# Patient Record
Sex: Female | Born: 1985 | Race: Black or African American | Hispanic: No | Marital: Single | Smoking: Never smoker
Health system: Southern US, Community
[De-identification: ages and names within clinical notes are randomized; demographics above are authoritative.]

## PROBLEM LIST (undated history)

## (undated) ENCOUNTER — Inpatient Hospital Stay (HOSPITAL_COMMUNITY): Payer: Self-pay

## (undated) DIAGNOSIS — I10 Essential (primary) hypertension: Secondary | ICD-10-CM

## (undated) DIAGNOSIS — T8859XA Other complications of anesthesia, initial encounter: Secondary | ICD-10-CM

## (undated) DIAGNOSIS — T4145XA Adverse effect of unspecified anesthetic, initial encounter: Secondary | ICD-10-CM

## (undated) DIAGNOSIS — A749 Chlamydial infection, unspecified: Secondary | ICD-10-CM

## (undated) HISTORY — PX: THERAPEUTIC ABORTION: SHX798

## (undated) HISTORY — PX: GANGLION CYST EXCISION: SHX1691

## (undated) HISTORY — PX: BREAST SURGERY: SHX581

---

## 2008-08-08 DIAGNOSIS — A749 Chlamydial infection, unspecified: Secondary | ICD-10-CM

## 2008-08-08 HISTORY — DX: Chlamydial infection, unspecified: A74.9

## 2010-07-12 ENCOUNTER — Encounter: Admission: RE | Admit: 2010-07-12 | Discharge: 2010-07-12 | Payer: Self-pay | Admitting: Obstetrics

## 2010-10-04 ENCOUNTER — Other Ambulatory Visit: Payer: Self-pay | Admitting: General Surgery

## 2010-10-04 ENCOUNTER — Ambulatory Visit (HOSPITAL_BASED_OUTPATIENT_CLINIC_OR_DEPARTMENT_OTHER)
Admission: RE | Admit: 2010-10-04 | Discharge: 2010-10-04 | Disposition: A | Discharge status: Home / Self Care | Payer: Medicaid Other | Source: Ambulatory Visit | Attending: General Surgery | Admitting: General Surgery

## 2010-10-04 DIAGNOSIS — N6039 Fibrosclerosis of unspecified breast: Secondary | ICD-10-CM | POA: Insufficient documentation

## 2010-10-14 NOTE — Op Note (Signed)
  NAMELEETTA, Meagan Ashley                ACCOUNT NO.:  [id number]  MEDICAL RECORD NO.:  [id number]           PATIENT TYPE:  LOCATION:                                 FACILITY:  PHYSICIAN:  Cherylynn Ridges, M.D.         DATE OF BIRTH:  DATE OF PROCEDURE:  10/04/2010 DATE OF DISCHARGE:                              OPERATIVE REPORT   PREOPERATIVE DIAGNOSIS:  Left breast mass.  POSTOPERATIVE DIAGNOSIS:  Left breast mass.  PROCEDURE:  Left breast biopsy.  SURGEON:  Cherylynn Ridges, MD  ANESTHESIA:  General with laryngeal airway.  ESTIMATED BLOOD LOSS:  Less than 20 mL.  No complications.  CONDITION:  Stable.  FINDINGS:  No distinct fibroadenoma, but significant fibrosis change in the area of the marked breast mass on preoperative evaluation.  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  OPERATION:  The patient was taken to the operating room and placed on table in supine position.  After an adequate general laryngeal airway anesthetic was administered, she was prepped and draped in usual sterile manner exposing her left breast.  After proper time-out was performed identifying the patient and the procedure being done, a curvilinear incision was made using #15 blade approximately 3-4 cm long.  We dissected out the palpable breast tissue in that area using a #15 blade and also Metzenbaum scissors.  We took two different specimens, making sure that we got the palpable lesion known and also identified preoperatively.  The bulk of the procedure was utilized obtained adequate hemostasis. The patient had significant bleeding from her breast tissue and suture ligatures were necessary in order to control the bleeding and also electrocautery.  We then closed in three layers of deep 3-0  Vicryl layer and one superficial 4-0 Vicryl layer and then the skin was closed using running subcuticular stitch of 4-0 Monocryl.  Marcaine 0.25% with epi was injected in the wound.  All needle counts, sponge counts, and instrument counts were correct.  Steri-Strips, Dermabond, and Tegaderm were used for the final dressing.     Cherylynn Ridges, M.D.     JOW/MEDQ  D:  10/04/2010  T:  10/05/2010  Job:  [id number]  Electronically Signed by Jimmye Norman M.D. on 10/13/2010 01:52:31 PM

## 2010-12-14 ENCOUNTER — Encounter (INDEPENDENT_AMBULATORY_CARE_PROVIDER_SITE_OTHER): Payer: Self-pay | Admitting: General Surgery

## 2011-10-15 ENCOUNTER — Emergency Department (HOSPITAL_COMMUNITY)
Admission: EM | Admit: 2011-10-15 | Discharge: 2011-10-15 | Disposition: A | Discharge status: Home Health | Payer: Medicaid Other | Attending: Emergency Medicine | Admitting: Emergency Medicine

## 2011-10-15 ENCOUNTER — Encounter (HOSPITAL_COMMUNITY): Payer: Self-pay | Admitting: Emergency Medicine

## 2011-10-15 DIAGNOSIS — I10 Essential (primary) hypertension: Secondary | ICD-10-CM | POA: Insufficient documentation

## 2011-10-15 DIAGNOSIS — N644 Mastodynia: Secondary | ICD-10-CM | POA: Insufficient documentation

## 2011-10-15 HISTORY — DX: Essential (primary) hypertension: I10

## 2011-10-15 MED ORDER — IBUPROFEN 800 MG PO TABS
800.0000 mg | ORAL_TABLET | Freq: Once | ORAL | Status: AC
  Administered 2011-10-15: 800 mg via ORAL
  Filled 2011-10-15: qty 1

## 2011-10-15 MED ORDER — IBUPROFEN 600 MG PO TABS: 600.0000 mg | ORAL_TABLET | Freq: Four times a day (QID) | ORAL | Status: AC | PRN

## 2011-10-15 NOTE — ED Notes (Signed)
PT. REPORTS LEFT BREAST PAIN / SORENESS FOR SEVERAL WEEKS , DENIES INJURY, STATES LUMPECTOMY LAST YEAR , NO DISCHARGE.

## 2011-10-15 NOTE — ED Notes (Signed)
Patient given discharge paperwork; went over discharge instructions with patient.  Patient instructed to follow up with Dr. Gaynell Face as scheduled for a mammogram.   Patient instructed to take ibuprofen for pain and to return to the ED for new, worsening, or concerning symptoms.

## 2011-10-15 NOTE — ED Notes (Addendum)
Patient complaining of left breast pain for the past month.  Patient describes the pain as a "sharp shooting pain".  Patient states that she had a lumpectomy last year (for benign).  Patient states that her mom had breast cancer at a young age.  Denies discharge from breast.  Patient alert and oriented x4; PERRL present.  Upon arrival to room, patient changed into gown and given warm blankets.  Family present at bedside.  Will continue to monitor.

## 2011-10-15 NOTE — ED Notes (Signed)
Registration at bedside.

## 2011-10-15 NOTE — ED Provider Notes (Signed)
History     CSN: [id number]  Arrival date & time 10/15/11  2053   First MD Initiated Contact with Patient 10/15/11 2241      Chief Complaint  Patient presents with  . Breast Pain    (Consider location/radiation/quality/duration/timing/severity/associated sxs/prior treatment) HPI Comments: Patient has had left breast pain for over a month.  Today she is on the third day of her menstrual cycle.  She states that this has not made a difference in the pain has been a steady, sharp pain on a daily basis.  She has taken no medication for this discomfort, chest and with Dr. Gaynell Face next Friday.  She has a history of having had a lumpectomy in the breast, which was benign.  She has not noticed any erythema.  Nipple discharge, mass.  The history is provided by the patient.    Past Medical History  Diagnosis Date  . Hypertension     History reviewed. No pertinent past surgical history.  No family history on file.  History  Substance Use Topics  . Smoking status: Never Smoker   . Smokeless tobacco: Not on file  . Alcohol Use: Yes    OB History    Grav Para Term Preterm Abortions TAB SAB Ect Mult Living                  Review of Systems  Constitutional: Negative for fever.  Cardiovascular: Positive for chest pain.  Neurological: Negative for weakness and headaches.    Allergies  Review of patient's allergies indicates no known allergies.  Home Medications  No current outpatient prescriptions on file.  BP 124/69  Pulse 73  Temp(Src) 97.7 F (36.5 C) (Oral)  Resp 16  Ht 5\' 6"  (1.676 m)  Wt 170 lb (77.111 kg)  BMI 27.44 kg/m2  SpO2 100%  LMP 10/12/2011  Physical Exam  Constitutional: She is oriented to person, place, and time.  Neck: Normal range of motion.  Cardiovascular: Normal rate.   Pulmonary/Chest:         Negative breast exam to palpation.  Negative skin changes, with  arm maneuvers.  No axillary lymph node enlargement  Neurological: She is alert and  oriented to person, place, and time.  Skin: Skin is warm.    ED Course  Procedures (including critical care time)  Labs Reviewed - No data to display No results found.   1. Breast pain in female       MDM  Negative.  Breast exam encouraged patient to keep her up with Dr. Gaynell Face on Friday.  Will prescribe ibuprofen.  I will also refer her to the breast imaging center for a mammogram        Arman Filter, NP 10/15/11 2304  Arman Filter, NP 10/15/11 2309

## 2011-10-15 NOTE — Discharge Instructions (Signed)
Mammogram A mammogram is an X-ray test to find changes in a woman's breast. You should get a mammogram if:  You are over 24 years of age.   You have risk factors.   Your doctor recommends that you have one.  BEFORE THE TEST  Do not schedule the test the week before your period, especially if your breasts are sore during this time.  On the day of your mammogram:  Wash your breasts and armpits well. After washing, do not put on any deodorant or talcum powder on until after your test.   Eat and drink as you usually do.   Take your medicines as usual.   If you are diabetic and take insulin, make sure you:   Eat before coming for your test.   Take your insulin as usual.   If you cannot keep your appointment, call before the appointment to cancel. Schedule another appointment.  TEST  You will need to undress from the waist up. You will put on a hospital gown.   Your breast will be put on the mammogram machine, and it will press firmly on your breast with a piece of plastic called a compression paddle. This will make your breast flatter so that the machine can X-ray all parts of your breast.   Both breasts will be X-rayed. Each breast will be X-rayed from above and from the side. An X-ray might need to be taken again if the picture is not good enough.   The mammogram will last about 15 to 30 minutes.  AFTER THE TEST Finding out the results of your test Ask when your test results will be ready. Make sure you get your test results. Document Released: 10/21/2008 Document Revised: 07/14/2011 Document Reviewed: 10/21/2008 [facility] Patient Information 2012 [place], [place].Breast Tenderness Breast tenderness is a common complaint made by women of all ages. It is also called mastalgia or mastodynia, which means breast pain. The condition can range from mild discomfort to severe pain. It has a variety of causes. Your caregiver will find out the likely cause of your breast tenderness by  examining your breasts, asking you about symptoms and perhaps ordering some tests. Breast tenderness usually does not mean you have breast cancer. CAUSES  Breast tenderness has many possible causes. They include:  Premenstrual changes. A week to 10 days before your period, your breasts might ache or feel tender.   Other hormonal causes. These include:   When sexual and physical traits mature (puberty).   Pregnancy.   The time right before and the year after menopause (perimenopause).   The day when it has been 12 months since your last period (menopause).   Large breasts.   Infection (also called mastitis).   Birth control pills.   Breastfeeding. Tenderness can occur if the breasts are overfull with milk or if a milk duct is blocked.   Injury.   Fibrocystic breast changes. This is not cancer (benign). It causes painful breasts that feel lumpy.   Fluid-filled sacs (cysts). Often cysts can be drained in your healthcare provider's office.   Fibroadenoma. This is a tumor that is not cancerous.   Medication side effects. Blood pressure drugs and diuretics (which increase urine flow) sometimes cause breast tenderness.   Previous breast surgery, such as a breast reduction.   Breast cancer. Cancer is rarely the reason breasts are tender. In most women, tenderness is caused by something else.  DIAGNOSIS  Several methods can be used to find out why your breasts  are tender. They include:  Visual inspection of the breasts.   Examination by hand.   Tests, such as:   Mammogram.   Ultrasound.   Biopsy.   Lab test of any fluid coming from the nipple.   Blood tests.   MRI.  TREATMENT  Treatment is directed to the cause of the breast tenderness from doing nothing for minor discomfort, wearing a good support bra but also may include:  Taking over-the-counter medicines for pain or discomfort as directed by your caregiver.   Prescription medicine for breast tenderness related  to:   Premenstrual.   Fibrocystic.   Puberty.   Pregnancy.   Menopause.   Previous breast surgery.   Large breasts.   Antibiotics for infection.   Birth control pills for fibrocystic and premenstrual changes.   More frequent feedings or pumping of the breasts and warm compresses for breast engorgement when nursing.   Cold and warm compresses and a good support bra for most breast injuries.   Breast cysts are sometimes drained with a needle (aspiration) or removed with minor surgery.   Fibroadenomas are usually removed with minor surgery.   Changing or stopping the medicine when it is responsible for causing the breast tenderness.   When breast cancer is present with or without causing pain, it is usually treated with major surgery (with or without radiation) and chemotherapy.  HOME CARE INSTRUCTIONS  Breast tenderness often can be handled at home. You can try:  Getting fitted for a new bra that provides more support, especially during exercise.   Wearing a more supportive or sports bra while sleeping when your breasts are very tender.   If you have a breast injury, using an ice pack for 15 to 20 minutes. Wrap the pack in a towel. Do not put the ice pack directly on your breast.   If your breasts are too full of milk as a result of breastfeeding, try:   Expressing milk either by hand or with a breast pump.   Applying a warm compress for relief.   Taking over-the-counter pain relievers, if this is OK with your caregiver.   Taking medicine that your caregiver prescribes. These might include antibiotics or birth control pills.  Over the long term, your breast tenderness might be eased if you:  Cut down on caffeine.   Reduce the amount of fat in your diet.  Also, learn how to do breast examinations at home. This will help you tell when you have an unusual growth or lump that could cause tenderness. And keep a log of the days and times when your breasts are most  tender. This will help you and your caregiver find the right solution. SEEK MEDICAL CARE IF:   Any part of your breast is hard, red and hot to the touch. This could be a sign of infection.   Fluid is coming out of your nipples (and you are not breastfeeding). Especially watch for blood or pus.   You have a fever as well as breast tenderness.   You have a new or painful lump in your breast that remains after your period ends.   You have tried to take care of the pain at home, but it has not gone away.   Your breast pain is getting worse. Or, the pain is making it hard to do the things you usually do during your day.  Document Released: 07/07/2008 Document Revised: 07/14/2011 Document Reviewed: 07/07/2008 [facility] Patient Information 2012 [place], [place]. If you  have not heard from the breast imaging center by Monday afternoon.  Please call and schedule an appointment, you can provided with a number

## 2011-10-15 NOTE — ED Provider Notes (Signed)
Medical screening examination/treatment/procedure(s) were performed by non-physician practitioner and as supervising physician I was immediately available for consultation/collaboration.   Vida Roller, MD 10/15/11 [phone]

## 2011-10-26 ENCOUNTER — Other Ambulatory Visit: Payer: Self-pay | Admitting: Obstetrics

## 2011-10-26 DIAGNOSIS — N644 Mastodynia: Secondary | ICD-10-CM

## 2011-10-27 ENCOUNTER — Other Ambulatory Visit: Payer: Medicaid Other

## 2011-11-10 ENCOUNTER — Other Ambulatory Visit: Payer: Medicaid Other

## 2013-11-27 ENCOUNTER — Other Ambulatory Visit: Payer: Self-pay | Admitting: Nurse Practitioner

## 2013-11-27 DIAGNOSIS — N644 Mastodynia: Secondary | ICD-10-CM

## 2013-11-27 DIAGNOSIS — N63 Unspecified lump in unspecified breast: Secondary | ICD-10-CM

## 2013-11-27 DIAGNOSIS — N6320 Unspecified lump in the left breast, unspecified quadrant: Secondary | ICD-10-CM

## 2013-11-27 DIAGNOSIS — Z803 Family history of malignant neoplasm of breast: Secondary | ICD-10-CM

## 2013-12-06 ENCOUNTER — Other Ambulatory Visit: Payer: Medicaid Other

## 2014-03-18 ENCOUNTER — Other Ambulatory Visit: Payer: Self-pay | Admitting: Obstetrics and Gynecology

## 2014-03-18 ENCOUNTER — Encounter (HOSPITAL_COMMUNITY): Payer: Self-pay

## 2014-03-18 ENCOUNTER — Ambulatory Visit (HOSPITAL_COMMUNITY)
Admission: RE | Admit: 2014-03-18 | Discharge: 2014-03-18 | Disposition: A | Discharge status: Home / Self Care | Payer: Medicaid Other | Source: Ambulatory Visit | Attending: Obstetrics and Gynecology | Admitting: Obstetrics and Gynecology

## 2014-03-18 VITALS — BP 102/64 | Temp 98.4°F | Ht 66.0 in | Wt 180.6 lb

## 2014-03-18 DIAGNOSIS — N644 Mastodynia: Secondary | ICD-10-CM | POA: Insufficient documentation

## 2014-03-18 DIAGNOSIS — Z1239 Encounter for other screening for malignant neoplasm of breast: Secondary | ICD-10-CM

## 2014-03-18 DIAGNOSIS — R87610 Atypical squamous cells of undetermined significance on cytologic smear of cervix (ASC-US): Secondary | ICD-10-CM

## 2014-03-18 DIAGNOSIS — N6321 Unspecified lump in the left breast, upper outer quadrant: Secondary | ICD-10-CM | POA: Insufficient documentation

## 2014-03-18 DIAGNOSIS — N63 Unspecified lump in unspecified breast: Secondary | ICD-10-CM

## 2014-03-18 DIAGNOSIS — N6311 Unspecified lump in the right breast, upper outer quadrant: Secondary | ICD-10-CM | POA: Insufficient documentation

## 2014-03-18 DIAGNOSIS — R87810 Cervical high risk human papillomavirus (HPV) DNA test positive: Secondary | ICD-10-CM

## 2014-03-18 NOTE — Patient Instructions (Signed)
Explained to Derl BarrowSheena Secrest that she did not need a Pap smear today due to last Pap smear was 01/23/2014. [facility] to follow up for abnormal Pap smear. The Clinics or Martie LeeSabrina will call patient with appointment within the next week. Told patient if doesn't get a phone call to call either Sabrina or I to follow-up. [facility]. Appointment scheduled for Friday, March 21, 2014 at 1400. Patient aware of appointment and will be there. Hayde Querry verbalized understanding.

## 2014-03-18 NOTE — Progress Notes (Signed)
[facility] due to needing a colposcopy to follow-up for abnormal Pap smear on 01/23/2014. Patient complained of bilateral breast pain and lumps. Patient stated pain comes and goes. Patient stated pain is greater within her left breast rating at a 8 out of 10.  Pap Smear:  Pap smear not completed today. [facility] and [facility] HPV[facility] the [facility] for colposcopy to follow up for abnormal Pap smear. The Clinics or Martie LeeSabrina will call patient with appointment within the next week. Told patient if doesn't get a phone call to call either Sabrina or I to follow-up. Per patient has no history of abnormal Pap smears prior to the most recent Pap smear. Pap smear result is scanned in EPIC under media.  Physical exam: Breasts Breasts symmetrical. No skin abnormalities right breast. Scar on left upper breast from history of excision on 10/04/2010 to remove a benign lump. No nipple retraction bilateral breasts. No nipple discharge bilateral breasts. No lymphadenopathy. Palpated a lump within the left breast that is less than the size of a pea at 2 o'clock 4 cm from the nipple. Palpated a moveable lump within the right breast at 10 o'clock 2 cm from the nipple. Complaints of left upper breast and right inner breast tenderness on exam. [facility]. Appointment scheduled for Friday, March 21, 2014 at 1400.  Pelvic/Bimanual No Pap smear completed today since last Pap smear was 01/23/2014. Pap smear not indicated per [facility] guidelines.

## 2014-03-20 ENCOUNTER — Encounter (HOSPITAL_COMMUNITY): Payer: Self-pay

## 2014-03-21 ENCOUNTER — Other Ambulatory Visit: Payer: Medicaid Other

## 2014-04-01 ENCOUNTER — Other Ambulatory Visit: Payer: Self-pay

## 2014-04-04 ENCOUNTER — Other Ambulatory Visit: Payer: Self-pay

## 2014-04-07 ENCOUNTER — Ambulatory Visit
Admission: RE | Admit: 2014-04-07 | Discharge: 2014-04-07 | Disposition: A | Discharge status: Home / Self Care | Payer: No Typology Code available for payment source | Source: Ambulatory Visit | Attending: Obstetrics and Gynecology | Admitting: Obstetrics and Gynecology

## 2014-04-07 ENCOUNTER — Encounter (INDEPENDENT_AMBULATORY_CARE_PROVIDER_SITE_OTHER): Payer: Self-pay

## 2014-04-07 DIAGNOSIS — N63 Unspecified lump in unspecified breast: Secondary | ICD-10-CM

## 2014-04-09 ENCOUNTER — Encounter: Payer: Medicaid Other | Admitting: Obstetrics & Gynecology

## 2014-05-23 ENCOUNTER — Telehealth (HOSPITAL_COMMUNITY): Payer: Self-pay | Admitting: *Deleted

## 2014-05-23 NOTE — Telephone Encounter (Signed)
Telephoned patient at home # and left message to [facility]. Need to advise patient to reschedule colpo appointment.

## 2014-05-23 NOTE — Telephone Encounter (Signed)
Patient returned call. [facility] and schedule [facility]. Advised patient was very important to get this scheduled. Patient voiced understanding.

## 2014-06-09 ENCOUNTER — Encounter (HOSPITAL_COMMUNITY): Payer: Self-pay

## 2014-07-10 ENCOUNTER — Encounter: Payer: Self-pay | Admitting: Obstetrics and Gynecology

## 2014-07-10 ENCOUNTER — Other Ambulatory Visit (HOSPITAL_COMMUNITY)
Admission: RE | Admit: 2014-07-10 | Discharge: 2014-07-10 | Disposition: A | Discharge status: Home / Self Care | Payer: Self-pay | Source: Ambulatory Visit | Attending: Obstetrics and Gynecology | Admitting: Obstetrics and Gynecology

## 2014-07-10 ENCOUNTER — Ambulatory Visit (INDEPENDENT_AMBULATORY_CARE_PROVIDER_SITE_OTHER): Payer: Self-pay | Admitting: Obstetrics and Gynecology

## 2014-07-10 VITALS — BP 117/73 | HR 79 | Temp 97.9°F | Ht 65.0 in | Wt 179.8 lb

## 2014-07-10 DIAGNOSIS — R87810 Cervical high risk human papillomavirus (HPV) DNA test positive: Secondary | ICD-10-CM

## 2014-07-10 DIAGNOSIS — R87610 Atypical squamous cells of undetermined significance on cytologic smear of cervix (ASC-US): Secondary | ICD-10-CM

## 2014-07-10 LAB — POCT PREGNANCY, URINE: Preg Test, Ur: NEGATIVE

## 2014-07-10 NOTE — Progress Notes (Signed)
Patient ID: Derl BarrowSheena Ashley, female   DOB: [birth year], 28 y.o.   MRN: [id number] Patient with abnormal pap smear on 01/23/2014- [facility] +HPV here for colposcopy Patient given informed consent, signed copy in the chart, time out was performed.  Placed in lithotomy position. Cervix viewed with speculum and colposcope after application of acetic acid.   Colposcopy adequate?  TZ not visuliazed Acetowhite lesions?12 o'clock Punctation?no Mosaicism?  no Abnormal vasculature?  no Biopsies?yes 12 o'clock ECC?yes  COMMENTS: Patient was given post procedure instructions.  She will return in 2 weeks for results.

## 2014-07-25 ENCOUNTER — Encounter: Payer: Self-pay | Admitting: *Deleted

## 2014-08-07 ENCOUNTER — Emergency Department (HOSPITAL_COMMUNITY)
Admission: EM | Admit: 2014-08-07 | Discharge: 2014-08-07 | Disposition: A | Discharge status: Home / Self Care | Payer: Self-pay | Attending: Emergency Medicine | Admitting: Emergency Medicine

## 2014-08-07 ENCOUNTER — Encounter (HOSPITAL_COMMUNITY): Payer: Self-pay | Admitting: *Deleted

## 2014-08-07 ENCOUNTER — Emergency Department (HOSPITAL_COMMUNITY): Payer: Self-pay

## 2014-08-07 DIAGNOSIS — S0083XA Contusion of other part of head, initial encounter: Secondary | ICD-10-CM | POA: Insufficient documentation

## 2014-08-07 DIAGNOSIS — Y998 Other external cause status: Secondary | ICD-10-CM | POA: Insufficient documentation

## 2014-08-07 DIAGNOSIS — S8002XA Contusion of left knee, initial encounter: Secondary | ICD-10-CM | POA: Insufficient documentation

## 2014-08-07 DIAGNOSIS — R52 Pain, unspecified: Secondary | ICD-10-CM

## 2014-08-07 DIAGNOSIS — S134XXA Sprain of ligaments of cervical spine, initial encounter: Secondary | ICD-10-CM | POA: Insufficient documentation

## 2014-08-07 DIAGNOSIS — I10 Essential (primary) hypertension: Secondary | ICD-10-CM | POA: Insufficient documentation

## 2014-08-07 DIAGNOSIS — Y92410 Unspecified street and highway as the place of occurrence of the external cause: Secondary | ICD-10-CM | POA: Insufficient documentation

## 2014-08-07 DIAGNOSIS — S39012A Strain of muscle, fascia and tendon of lower back, initial encounter: Secondary | ICD-10-CM | POA: Insufficient documentation

## 2014-08-07 DIAGNOSIS — Y9389 Activity, other specified: Secondary | ICD-10-CM | POA: Insufficient documentation

## 2014-08-07 DIAGNOSIS — S80211A Abrasion, right knee, initial encounter: Secondary | ICD-10-CM | POA: Insufficient documentation

## 2014-08-07 DIAGNOSIS — R51 Headache: Secondary | ICD-10-CM | POA: Insufficient documentation

## 2014-08-07 MED ORDER — CYCLOBENZAPRINE HCL 10 MG PO TABS: 10.0000 mg | ORAL_TABLET | Freq: Two times a day (BID) | ORAL | Status: DC | PRN

## 2014-08-07 MED ORDER — ONDANSETRON 4 MG PO TBDP
4.0000 mg | ORAL_TABLET | Freq: Once | ORAL | Status: AC
  Administered 2014-08-07: 4 mg via ORAL
  Filled 2014-08-07: qty 1

## 2014-08-07 MED ORDER — MORPHINE SULFATE 4 MG/ML IJ SOLN
4.0000 mg | Freq: Once | INTRAMUSCULAR | Status: AC
  Administered 2014-08-07: 4 mg via INTRAVENOUS
  Filled 2014-08-07: qty 1

## 2014-08-07 MED ORDER — OXYCODONE-ACETAMINOPHEN 5-325 MG PO TABS: 1.0000 | ORAL_TABLET | Freq: Four times a day (QID) | ORAL | Status: DC | PRN

## 2014-08-07 MED ORDER — OXYCODONE-ACETAMINOPHEN 5-325 MG PO TABS
1.0000 | ORAL_TABLET | Freq: Once | ORAL | Status: AC
  Administered 2014-08-07: 1 via ORAL
  Filled 2014-08-07: qty 1

## 2014-08-07 MED ORDER — IBUPROFEN 600 MG PO TABS: 600.0000 mg | ORAL_TABLET | Freq: Four times a day (QID) | ORAL | Status: DC | PRN

## 2014-08-07 MED ORDER — CYCLOBENZAPRINE HCL 10 MG PO TABS
5.0000 mg | ORAL_TABLET | Freq: Once | ORAL | Status: AC
  Administered 2014-08-07: 5 mg via ORAL
  Filled 2014-08-07: qty 1
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
## 2014-08-07 NOTE — Progress Notes (Signed)
Orthopedic Tech Progress Note Patient Details:  Meagan BarrowSheena Ashley [birth year] [id number]  Ortho Devices Type of Ortho Device: Soft collar Ortho Device/Splint Location: [place] [facility] Ortho Device/Splint Interventions: Application   Meagan Ashley 08/07/2014, 2:25 PM
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
## 2014-08-07 NOTE — ED Provider Notes (Signed)
Patient signed out to me at shift change awaiting completion of radiology exam. Radiology results reviewed and shared with patient.  Patient received discharge instructions and prescriptions.  Jimmye Normanavid John Ica Daye, NP 08/07/14 1628  Tilden FossaElizabeth Rees, MD 08/07/14 Mikle Bosworth1902
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
## 2014-08-07 NOTE — Progress Notes (Signed)
Orthopedic Tech Progress Note Patient Details:  Meagan Ashley 08/27/1985 [id number]  Patient ID: Meagan Ashley, female   DOB: 08/27/1985, 28 y.o.   MRN: [id number] Viewed order from doctor's order list  Nikki DomCrawford, Jeanise Durfey 08/07/2014, 2:26 PM

## 2015-01-20 ENCOUNTER — Other Ambulatory Visit: Payer: Self-pay | Admitting: Neurology

## 2015-01-20 ENCOUNTER — Ambulatory Visit (HOSPITAL_COMMUNITY)
Admission: RE | Admit: 2015-01-20 | Discharge: 2015-01-20 | Disposition: A | Discharge status: Home / Self Care | Payer: Self-pay | Source: Ambulatory Visit | Attending: Neurology | Admitting: Neurology

## 2015-01-20 DIAGNOSIS — R609 Edema, unspecified: Secondary | ICD-10-CM

## 2015-01-20 DIAGNOSIS — M7989 Other specified soft tissue disorders: Secondary | ICD-10-CM | POA: Insufficient documentation

## 2016-01-28 LAB — CYTOLOGY - PAP: Pap: NEGATIVE

## 2017-01-17 ENCOUNTER — Encounter (HOSPITAL_COMMUNITY): Payer: Self-pay | Admitting: *Deleted

## 2018-08-25 ENCOUNTER — Inpatient Hospital Stay (HOSPITAL_COMMUNITY): Payer: Managed Care, Other (non HMO)

## 2018-08-25 ENCOUNTER — Inpatient Hospital Stay (HOSPITAL_COMMUNITY)
Admission: AD | Admit: 2018-08-25 | Discharge: 2018-08-25 | Disposition: A | Discharge status: Home / Self Care | Payer: Managed Care, Other (non HMO) | Source: Ambulatory Visit | Attending: Obstetrics and Gynecology | Admitting: Obstetrics and Gynecology

## 2018-08-25 ENCOUNTER — Encounter (HOSPITAL_COMMUNITY): Payer: Self-pay | Admitting: *Deleted

## 2018-08-25 DIAGNOSIS — O26891 Other specified pregnancy related conditions, first trimester: Secondary | ICD-10-CM | POA: Diagnosis not present

## 2018-08-25 DIAGNOSIS — R1030 Lower abdominal pain, unspecified: Secondary | ICD-10-CM | POA: Insufficient documentation

## 2018-08-25 DIAGNOSIS — R109 Unspecified abdominal pain: Secondary | ICD-10-CM | POA: Diagnosis not present

## 2018-08-25 DIAGNOSIS — Z3A01 Less than 8 weeks gestation of pregnancy: Secondary | ICD-10-CM | POA: Diagnosis not present

## 2018-08-25 DIAGNOSIS — Z3491 Encounter for supervision of normal pregnancy, unspecified, first trimester: Secondary | ICD-10-CM

## 2018-08-25 HISTORY — DX: Other complications of anesthesia, initial encounter: T88.59XA

## 2018-08-25 HISTORY — DX: Adverse effect of unspecified anesthetic, initial encounter: T41.45XA

## 2018-08-25 HISTORY — DX: Chlamydial infection, unspecified: A74.9

## 2018-08-25 LAB — URINALYSIS, ROUTINE W REFLEX MICROSCOPIC
BILIRUBIN URINE: NEGATIVE
Glucose, UA: NEGATIVE mg/dL
Hgb urine dipstick: NEGATIVE
Ketones, ur: NEGATIVE mg/dL
Nitrite: NEGATIVE
Protein, ur: NEGATIVE mg/dL
SPECIFIC GRAVITY, URINE: 1.021 (ref 1.005–1.030)
pH: 7 (ref 5.0–8.0)

## 2018-08-25 LAB — CBC
HCT: 34.7 % — ABNORMAL LOW (ref 36.0–46.0)
HEMOGLOBIN: 11.4 g/dL — AB (ref 12.0–15.0)
MCH: 31 pg (ref 26.0–34.0)
MCHC: 32.9 g/dL (ref 30.0–36.0)
MCV: 94.3 fL (ref 80.0–100.0)
Platelets: 277 10*3/uL (ref 150–400)
RBC: 3.68 MIL/uL — ABNORMAL LOW (ref 3.87–5.11)
RDW: 13 % (ref 11.5–15.5)
WBC: 8.5 10*3/uL (ref 4.0–10.5)
nRBC: 0 % (ref 0.0–0.2)

## 2018-08-25 LAB — POCT PREGNANCY, URINE: Preg Test, Ur: POSITIVE — AB

## 2018-08-25 LAB — HCG, QUANTITATIVE, PREGNANCY: hCG, Beta Chain, Quant, S: 44526 m[IU]/mL — ABNORMAL HIGH (ref <5)

## 2018-08-25 MED ORDER — PROMETHAZINE HCL 25 MG PO TABS: 25.0000 mg | ORAL_TABLET | Freq: Four times a day (QID) | ORAL | 0 refills | Status: DC | PRN

## 2018-08-25 NOTE — MAU Provider Note (Signed)
Chief Complaint: Abdominal Pain and Bloated   First Provider Initiated Contact with Patient 08/25/18 2139     SUBJECTIVE HPI: Meagan Ashley is a 33 y.o. [id number] at [id number] who presents to [facility] reporting abdominal pain and bloating. Symptoms started a week ago. Reports intermittent lower abdominal cramping. Also feels bloated and has had an increase in flatus. Some n/v, vomits about once every other day. Denies diarrhea, constipation, dysuria, vaginal bleeding, or vaginal discharge.   Location: lower abdomen Quality: cramping Severity: 6/10 on pain scale Duration: 1 week Timing: intermittent Modifying factors: none Associated signs and symptoms: none  Past Medical History:  Diagnosis Date  . Chlamydia 2010  . Complication of anesthesia    itching  . Hypertension    OB History  Gravida Para Term Preterm AB Living  4 1 1   2 1   SAB TAB Ectopic Multiple Live Births    2     1    # Outcome Date GA Lbr Len/2nd Weight Sex Delivery Anes PTL Lv  4 Current           3 TAB           2 TAB           1 Term      CS-LTranv   LIV   Past Surgical History:  Procedure Laterality Date  . BREAST SURGERY  27/27/2012  . CESAREAN SECTION    . GANGLION CYST EXCISION    . THERAPEUTIC ABORTION     Social History   Socioeconomic History  . Marital status: Single    Spouse name: Not on file  . Number of children: Not on file  . Years of education: Not on file  . Highest education level: Not on file  Occupational History  . Not on file  Social Needs  . Financial resource strain: Not on file  . Food insecurity:    Worry: Not on file    Inability: Not on file  . Transportation needs:    Medical: Not on file    Non-medical: Not on file  Tobacco Use  . Smoking status: Never Smoker  . Smokeless tobacco: Never Used  Substance and Sexual Activity  . Alcohol use: No  . Drug use: No  . Sexual activity: Yes    Birth control/protection: None  Lifestyle  . Physical activity:    Days per week: Not on file    Minutes per session: Not on file  . Stress: Not on file  Relationships  . Social connections:    Talks on phone: Not on file    Gets together: Not on file    Attends religious service: Not on file    Active member of club or organization: Not on file    Attends meetings of clubs or organizations: Not on file    Relationship status: Not on file  . Intimate partner violence:    Fear of current or ex partner: Not on file    Emotionally abused: Not on file    Physically abused: Not on file    Forced sexual activity: Not on file  Other Topics Concern  . Not on file  Social History Narrative  . Not on file   Family History  Problem Relation Age of Onset  . Breast cancer Mother   . Hypertension Mother    No current facility-administered medications on file prior to encounter.    No current outpatient medications on file prior to encounter.  No Known Allergies  I have reviewed patient's Past Medical Hx, Surgical Hx, Family Hx, Social Hx, medications and allergies.   Review of Systems  Constitutional: Negative.   Gastrointestinal: Positive for abdominal pain, nausea and vomiting. Negative for constipation and diarrhea.  Genitourinary: Negative.     OBJECTIVE Patient Vitals for the past 24 hrs:  BP Temp Pulse Resp Height Weight  08/25/18 2055 131/79 98.7 F (37.1 C) 83 18 5\' 6"  (1.676 m) 80.3 kg   Constitutional: Well-developed, well-nourished female in no acute distress.  Cardiovascular: normal rate & rhythm, no murmur Respiratory: normal rate and effort. Lung sounds clear throughout GI: Abd soft, non-tender, Pos BS x 4. No guarding or rebound tenderness MS: Extremities nontender, no edema, normal ROM Neurologic: Alert and oriented x 4.     LAB RESULTS Results for orders placed or performed during the hospital encounter of 08/25/18 (from the past 24 hour(s))  Urinalysis, Routine w reflex microscopic     Status: Abnormal   Collection  Time: 08/25/18  9:05 PM  Result Value Ref Range   Color, Urine YELLOW YELLOW   APPearance HAZY (A) CLEAR   Specific Gravity, Urine 1.021 1.005 - 1.030   pH 7.0 5.0 - 8.0   Glucose, UA NEGATIVE NEGATIVE mg/dL   Hgb urine dipstick NEGATIVE NEGATIVE   Bilirubin Urine NEGATIVE NEGATIVE   Ketones, ur NEGATIVE NEGATIVE mg/dL   Protein, ur NEGATIVE NEGATIVE mg/dL   Nitrite NEGATIVE NEGATIVE   Leukocytes, UA SMALL (A) NEGATIVE   RBC / HPF 0-5 0 - 5 RBC/hpf   WBC, UA 0-5 0 - 5 WBC/hpf   Bacteria, UA RARE (A) NONE SEEN   Squamous Epithelial / LPF 21-50 0 - 5   Trans Epithel, UA <1    Mucus PRESENT   Pregnancy, urine POC     Status: Abnormal   Collection Time: 08/25/18  9:06 PM  Result Value Ref Range   Preg Test, Ur POSITIVE (A) NEGATIVE  CBC     Status: Abnormal   Collection Time: 08/25/18  9:54 PM  Result Value Ref Range   WBC 8.5 4.0 - 10.5 K/uL   RBC 3.68 (L) 3.87 - 5.11 MIL/uL   Hemoglobin 11.4 (L) 12.0 - 15.0 g/dL   HCT 16.134.7 (L) 09.636.0 - 04.546.0 %   MCV 94.3 80.0 - 100.0 fL   MCH 31.0 26.0 - 34.0 pg   MCHC 32.9 30.0 - 36.0 g/dL   RDW 40.913.0 81.111.5 - 91.415.5 %   Platelets 277 150 - 400 K/uL   nRBC 0.0 0.0 - 0.2 %  ABO/Rh     Status: None (Preliminary result)   Collection Time: 08/25/18  9:54 PM  Result Value Ref Range   ABO/RH(D)      A POS Performed at [facility], [address] [place], [place] [address]   hCG, quantitative, pregnancy     Status: Abnormal   Collection Time: 08/25/18  9:54 PM  Result Value Ref Range   hCG, Beta Chain, Quant, S 44,526 (H) <5 mIU/mL    IMAGING [place] Ob Less Than 14 Weeks With Ob Transvaginal  Result Date: 08/25/2018 CLINICAL DATA:  Abdominal pain during first-trimester pregnancy. EXAM: OBSTETRIC <14 WK US AND TRANSVAGINAL OB US TECHNIQUE: Both transabdominal and transvaginal ultrasound examinations were performed for complete evaluation of the gestation as well as the maternal uterus, adnexal regions, and pelvic cul-de-sac. Transvaginal  technique was performed to assess early pregnancy. COMPARISON:  None. FINDINGS: Intrauterine gestational sac: Present Yolk sac:  Present Embryo:  Present Cardiac Activity: Present Heart Rate: 119 bpm CRL:  3 mm   5 w   5 d                  [place] EDC: 04/22/2019 Subchorionic hemorrhage:  None visualized. Maternal uterus/adnexae: Left ovarian corpus luteal cyst measures on the order of 3.4 cm on image 14. No significant free fluid. IMPRESSION: 1. Intrauterine pregnancy of approximately 5 weeks 5 days with fetal heart rate of 119 beats per minute. 2. Left ovarian corpus luteal cyst. Electronically Signed   By: Jeronimo Greaves M.D.   On: 08/25/2018 23:03    MAU COURSE Orders Placed This Encounter  Procedures  . Culture, OB Urine  . US OB LESS THAN 14 WEEKS WITH OB TRANSVAGINAL  . Urinalysis, Routine w reflex microscopic  . CBC  . hCG, quantitative, pregnancy  . Pregnancy, urine POC  . ABO/Rh  . Discharge patient   Meds ordered this encounter  Medications  . promethazine (PHENERGAN) 25 MG tablet    Sig: Take 1 tablet (25 mg total) by mouth every 6 (six) hours as needed for nausea or vomiting.    Dispense:  30 tablet    Refill:  0    Order Specific Question:   Supervising Provider    Answer:   CONSTANT, PEGGY [[id number]]    MDM +UPT UA, CBC, ABO/Rh, quant hCG, and [place] today to rule out ectopic pregnancy  Ultrasound shows live IUP  ASSESSMENT 1. Normal IUP (intrauterine pregnancy) on prenatal ultrasound, first trimester   2. Abdominal pain during pregnancy in first trimester     PLAN Discharge home in stable condition. SAB precautions  Allergies as of 08/25/2018   No Known Allergies     Medication List    STOP taking these medications   cyclobenzaprine 10 MG tablet Commonly known as:  FLEXERIL   ibuprofen 600 MG tablet Commonly known as:  ADVIL,MOTRIN   oxyCODONE-acetaminophen 5-325 MG tablet Commonly known as:  PERCOCET/ROXICET     TAKE these medications   promethazine 25 MG  tablet Commonly known as:  PHENERGAN Take 1 tablet (25 mg total) by mouth every 6 (six) hours as needed for nausea or vomiting.        Judeth Horn, NP 08/26/2018  4:24 AM

## 2018-08-25 NOTE — Discharge Instructions (Signed)
First Trimester of Pregnancy  The first trimester of pregnancy is from week 1 until the end of week 13 (months 1 through 3). During this time, your baby will begin to develop inside you. At 6-8 weeks, the eyes and face are formed, and the heartbeat can be seen on ultrasound. At the end of 12 weeks, all the baby's organs are formed. Prenatal care is all the medical care you receive before the birth of your baby. Make sure you get good prenatal care and follow all of your doctor's instructions. Follow these instructions at home: Medicines  Take over-the-counter and prescription medicines only as told by your doctor. Some medicines are safe and some medicines are not safe during pregnancy.  Take a prenatal vitamin that contains at least 600 micrograms (mcg) of folic acid.  If you have trouble pooping (constipation), take medicine that will make your stool soft (stool softener) if your doctor approves. Eating and drinking   Eat regular, healthy meals.  Your doctor will tell you the amount of weight gain that is right for you.  Avoid raw meat and uncooked cheese.  If you feel sick to your stomach (nauseous) or throw up (vomit): ? Eat 4 or 5 small meals a day instead of 3 large meals. ? Try eating a few soda crackers. ? Drink liquids between meals instead of during meals.  To prevent constipation: ? Eat foods that are high in fiber, like fresh fruits and vegetables, whole grains, and beans. ? Drink enough fluids to keep your pee (urine) clear or pale yellow. Activity  Exercise only as told by your doctor. Stop exercising if you have cramps or pain in your lower belly (abdomen) or low back.  Do not exercise if it is too hot, too humid, or if you are in a place of great height (high altitude).  Try to avoid standing for long periods of time. Move your legs often if you must stand in one place for a long time.  Avoid heavy lifting.  Wear low-heeled shoes. Sit and stand up straight.   You can have sex unless your doctor tells you not to. Relieving pain and discomfort  Wear a good support bra if your breasts are sore.  Take warm water baths (sitz baths) to soothe pain or discomfort caused by hemorrhoids. Use hemorrhoid cream if your doctor says it is okay.  Rest with your legs raised if you have leg cramps or low back pain.  If you have puffy, bulging veins (varicose veins) in your legs: ? Wear support hose or compression stockings as told by your doctor. ? Raise (elevate) your feet for 15 minutes, 3-4 times a day. ? Limit salt in your food. Prenatal care  Schedule your prenatal visits by the twelfth week of pregnancy.  Write down your questions. Take them to your prenatal visits.  Keep all your prenatal visits as told by your doctor. This is important. Safety  Wear your seat belt at all times when driving.  Make a list of emergency phone numbers. The list should include numbers for family, friends, the hospital, and police and fire departments. General instructions  Ask your doctor for a referral to a local prenatal class. Begin classes no later than at the start of month 6 of your pregnancy.  Ask for help if you need counseling or if you need help with nutrition. Your doctor can give you advice or tell you where to go for help.  Do not use hot tubs, steam   rooms, or saunas.  Do not douche or use tampons or scented sanitary pads.  Do not cross your legs for long periods of time.  Avoid all herbs and alcohol. Avoid drugs that are not approved by your doctor.  Do not use any tobacco products, including cigarettes, chewing tobacco, and electronic cigarettes. If you need help quitting, ask your doctor. You may get counseling or other support to help you quit.  Avoid cat litter boxes and soil used by cats. These carry germs that can cause birth defects in the baby and can cause a loss of your baby (miscarriage) or stillbirth.  Visit your dentist. At home,  brush your teeth with a soft toothbrush. Be gentle when you floss. Contact a doctor if:  You are dizzy.  You have mild cramps or pressure in your lower belly.  You have a nagging pain in your belly area.  You continue to feel sick to your stomach, you throw up, or you have watery poop (diarrhea).  You have a bad smelling fluid coming from your vagina.  You have pain when you pee (urinate).  You have increased puffiness (swelling) in your face, hands, legs, or ankles. Get help right away if:  You have a fever.  You are leaking fluid from your vagina.  You have spotting or bleeding from your vagina.  You have very bad belly cramping or pain.  You gain or lose weight rapidly.  You throw up blood. It may look like coffee grounds.  You are around people who have German measles, fifth disease, or chickenpox.  You have a very bad headache.  You have shortness of breath.  You have any kind of trauma, such as from a fall or a car accident. Summary  The first trimester of pregnancy is from week 1 until the end of week 13 (months 1 through 3).  To take care of yourself and your unborn baby, you will need to eat healthy meals, take medicines only if your doctor tells you to do so, and do activities that are safe for you and your baby.  Keep all follow-up visits as told by your doctor. This is important as your doctor will have to ensure that your baby is healthy and growing well. This information is not intended to replace advice given to you by your health care provider. Make sure you discuss any questions you have with your health care provider. Document Released: 01/11/2008 Document Revised: 08/02/2016 Document Reviewed: 08/02/2016 Elsevier Interactive Patient Education  2019 [employer]  

## 2018-08-25 NOTE — MAU Note (Signed)
Having lower abd pain for a week. Some bloating. Increased urination but no dysuria. No vag bleeding or d/c. LMP 07/14/18

## 2018-08-26 LAB — ABO/RH: ABO/RH(D): A POS

## 2018-08-27 LAB — CULTURE, OB URINE

## 2019-01-15 ENCOUNTER — Other Ambulatory Visit: Payer: Self-pay

## 2019-01-15 ENCOUNTER — Encounter: Payer: Self-pay | Admitting: Advanced Practice Midwife

## 2019-01-15 ENCOUNTER — Ambulatory Visit (INDEPENDENT_AMBULATORY_CARE_PROVIDER_SITE_OTHER): Payer: Managed Care, Other (non HMO) | Admitting: Advanced Practice Midwife

## 2019-01-15 VITALS — BP 119/72 | HR 78 | Ht 66.0 in | Wt 166.1 lb

## 2019-01-15 DIAGNOSIS — Z30018 Encounter for initial prescription of other contraceptives: Secondary | ICD-10-CM

## 2019-01-15 DIAGNOSIS — Z8742 Personal history of other diseases of the female genital tract: Secondary | ICD-10-CM

## 2019-01-15 DIAGNOSIS — Z01419 Encounter for gynecological examination (general) (routine) without abnormal findings: Secondary | ICD-10-CM

## 2019-01-15 DIAGNOSIS — Z3202 Encounter for pregnancy test, result negative: Secondary | ICD-10-CM | POA: Diagnosis not present

## 2019-01-15 DIAGNOSIS — Z9889 Other specified postprocedural states: Secondary | ICD-10-CM

## 2019-01-15 DIAGNOSIS — N898 Other specified noninflammatory disorders of vagina: Secondary | ICD-10-CM

## 2019-01-15 LAB — POCT URINE PREGNANCY: Preg Test, Ur: NEGATIVE

## 2019-01-15 MED ORDER — ETONOGESTREL-ETHINYL ESTRADIOL 0.12-0.015 MG/24HR VA RING: 1.0000 | VAGINAL_RING | VAGINAL | 12 refills | Status: AC

## 2019-01-15 MED ORDER — FLUCONAZOLE 150 MG PO TABS: 150.0000 mg | ORAL_TABLET | Freq: Once | ORAL | 0 refills | Status: AC

## 2019-01-15 NOTE — Patient Instructions (Signed)
Human Papillomavirus Human papillomavirus (HPV) is the most common sexually transmitted infection (STI). It easily spreads from person to person (is highly contagious). HPV infections cause genital warts. Certain types of HPV may cause cancers, including cancer of the lower part of the uterus (cervix), vagina, outer female genital area (vulva), penis, anus, and rectum. HPV may also cause cancers of the oral cavity, such as the throat, tongue, and tonsils. There are many types of HPV. It usually does not cause symptoms. However, sometimes there are wart-like lesions in the throat or warts in the genital area that you can see or feel. It is possible to be infected for long periods and pass HPV to others without knowing it. What are the causes? HPV is caused by a virus that spreads from person to person through sexual contact. This includes oral, vaginal, or anal sex. What increases the risk? The following factors may make you more likely to develop this condition:  Having unprotected oral, vaginal, or anal sex.  Having several sex partners.  Having a sex partner who has other sex partners.  Having or having had another STI.  Having a weak disease-fighting (immune) system.  Having damaged skin in the genital area. What are the signs or symptoms? Most people who have HPV do not have any symptoms. If symptoms are present, they may include:  Wartlike lesions in the throat (from having oral sex).  Warts on the infected skin or mucous membranes.  Genital warts that may itch, burn, bleed, or be painful during sexual intercourse. How is this diagnosed? If wartlike lesions are present in the throat or if genital warts are present, your health care provider can usually diagnose HPV with a physical exam. Genital warts are easily seen. In females, tests may be used to diagnose HPV, including:  A Pap test. A Pap test takes a sample of cells from your cervix to check for cancer and HPV infection.  An  HPV test. This is similar to a Pap test and involves taking a sample of cells from your cervix.  Using a scope to view the cervix (colposcopy). This may be done if a pelvic exam or Pap test is abnormal. A sample of tissue may be removed for testing (biopsy) during the colposcopy. Currently, there is no test to detect HPV in males. How is this treated? There is no treatment for the virus itself. However, there are treatments for the health problems and symptoms HPV can cause. Your health care provider will monitor you closely after you are treated as HPV can come back and may need treatment again. Treatment for HPV may include:  Medicines, which may be injected or applied to genital warts in a cream, lotion, liquid or gel form.  Use of a probe to apply extreme cold (cryotherapy) to the genital warts.  Application of an intense beam of light (laser treatment) on the genital warts.  Use of a probe to apply extreme heat (electrocautery) on the genital warts.  Surgery to remove the genital warts. Follow these instructions at home: Medicines  Take over-the-counter and prescription medicines only as told by your health care provider. This include creams for itching or irritation.  Do not treat genital warts with medicines used for treating hand warts. General instructions  Do not touch or scratch the warts.  Do not have sex while you are being treated.  Do not douche or use tampons during treatment (women).  Tell your sex partner about your infection. He or she may  also need to be treated.  If you become pregnant, tell your health care provider that you have HPV. Your health care provider will monitor you closely during pregnancy to make sure your baby is safe.  Keep all follow-up visits as told by your health care provider. This is important. How is this prevented?  Talk with your health care provider about getting the HPV vaccines. These vaccines prevent some HPV infections and  cancers. The vaccines are recommended for males and females between the ages of 23 and 69. They will not work if you already have HPV, and they are not recommended for pregnant women.  After treatment, use condoms during sex to prevent future infections.  Have only one sex partner.  Have a sex partner who does not have other sex partners.  Get regular Pap tests as directed by your health care provider. Contact a health care provider if:  The treated skin becomes red, swollen, or painful.  You have a fever.  You feel generally ill.  You feel lumps or pimples sticking out in and around your genital area.  You develop bleeding of the vagina or the treatment area.  You have painful sexual intercourse. Summary  Human papillomavirus (HPV) is the most common sexually transmitted infection (STI) and is highly contagious.  Most people carrying HPV do not have any symptoms.  HPV can be prevented with vaccination. The vaccine is recommended for males and females between the ages of 60 and 68.  There is no treatment for the virus itself. However, there are treatments for the health problems and symptoms HPV can cause. This information is not intended to replace advice given to you by your health care provider. Make sure you discuss any questions you have with your health care provider. Document Released: 10/15/2003 Document Revised: 07/03/2016 Document Reviewed: 07/03/2016 Elsevier Interactive Patient Education  2019 [employer]. Pap Test Why am I having this test? A Pap test, also called a Pap smear, is a screening test to check for signs of:  Cancer of the vagina, cervix, and uterus. The cervix is the lower part of the uterus that opens into the vagina.  Infection.  Changes that may be a sign that cancer is developing (precancerous changes). Women need this test on a regular basis. In general, you should have a Pap test every 3 years until you reach menopause or age 20. Women aged  30-60 may choose to have their Pap test done at the same time as an HPV (human papillomavirus) test every 5 years (instead of every 3 years). Your health care provider may recommend having Pap tests more or less often depending on your medical conditions and past Pap test results. What kind of sample is taken?  Your health care provider will collect a sample of cells from the surface of your cervix. This will be done using a small cotton swab, plastic spatula, or brush. This sample is often collected during a pelvic exam, when you are lying on your back on an exam table with feet in footrests (stirrups). In some cases, fluids (secretions) from the cervix or vagina may also be collected. How do I prepare for this test?  Be aware of where you are in your menstrual cycle. If you are menstruating on the day of the test, you may be asked to reschedule.  You may need to reschedule if you have a known vaginal infection on the day of the test.  Follow instructions from your health care provider about: ?  Changing or stopping your regular medicines. Some medicines can cause abnormal test results, such as digitalis and tetracycline. ? Avoiding douching or taking a bath the day before or the day of the test. Tell a health care provider about:  Any allergies you have.  All medicines you are taking, including vitamins, herbs, eye drops, creams, and over-the-counter medicines.  Any blood disorders you have.  Any surgeries you have had.  Any medical conditions you have.  Whether you are pregnant or may be pregnant. How are the results reported? Your test results will be reported as either abnormal or normal. A false-positive result can occur. A false positive is incorrect because it means that a condition is present when it is not. A false-negative result can occur. A false negative is incorrect because it means that a condition is not present when it is. What do the results mean? A normal test  result means that you do not have signs of cancer of the vagina, cervix, or uterus. An abnormal result may mean that you have:  Cancer. A Pap test by itself is not enough to diagnose cancer. You will have more tests done in this case.  Precancerous changes in your vagina, cervix, or uterus.  Inflammation of the cervix.  An STD (sexually transmitted disease).  A fungal infection.  A parasite infection. Talk with your health care provider about what your results mean. Questions to ask your health care provider Ask your health care provider, or the department that is doing the test:  When will my results be ready?  How will I get my results?  What are my treatment options?  What other tests do I need?  What are my next steps? Summary  In general, women should have a Pap test every 3 years until they reach menopause or age 65.  Your health care provider will collect a sample of cells from the surface of your cervix. This will be done using a small cotton swab, plastic spatula, or brush.  In some cases, fluids (secretions) from the cervix or vagina may also be collected. This information is not intended to replace advice given to you by your health care provider. Make sure you discuss any questions you have with your health care provider. Document Released: 10/15/2002 Document Revised: 04/03/2017 Document Reviewed: 04/03/2017 Elsevier Interactive Patient Education  2019 [employer] Ethinyl Estradiol; Etonogestrel vaginal ring What is this medicine? ETHINYL ESTRADIOL; ETONOGESTREL (ETH in il es tra DYE ole; et oh noe JES trel) vaginal ring is a flexible, vaginal ring used as a contraceptive (birth control method). This medicine combines 2 types of female hormones, an estrogen and a progestin. This ring is used to prevent ovulation and pregnancy. Each ring is effective for 1 month. This medicine may be used for other purposes; ask your health care provider or pharmacist if you  have questions. COMMON BRAND NAME(S): NuvaRing What should I tell my health care provider before I take this medicine? They need to know if you have any of these conditions: -abnormal vaginal bleeding -blood vessel disease or blood clots -breast, cervical, endometrial, ovarian, liver, or uterine cancer -diabetes -gallbladder disease -having surgery -heart disease or recent heart attack -high blood pressure -high cholesterol or triglycerides -history of irregular heartbeat or heart valve problems -kidney disease -liver disease -migraine headaches -protein C deficiency -protein S deficiency -recently had a baby, miscarriage, or abortion -stroke -systemic lupus erythematosus (SLE) -tobacco smoker -your age is more than 31 years old -an unusual or  allergic reaction to estrogens, progestins, other medicines, foods, dyes, or preservatives -pregnant or trying to get pregnant -breast-feeding How should I use this medicine? Insert the ring into your vagina as directed. Follow the directions on the prescription label. The ring will remain place for 3 weeks and is then removed for a 1-week break. A new ring is inserted 1 week after the last ring was removed, on the same day of the week. Check often to make sure the ring is still in place. If the ring was out of the vagina for an unknown amount of time, you may not be protected from pregnancy. Perform a pregnancy test and call your doctor. Do not use more often than directed. A patient package insert for the product will be given with each prescription and refill. Read this sheet carefully each time. The sheet may change frequently. Contact your pediatrician regarding the use of this medicine in children. Special care may be needed. Overdosage: If you think you have taken too much of this medicine contact a poison control center or emergency room at once. NOTE: This medicine is only for you. Do not share this medicine with others. What if I miss  a dose? You will need to use the ring exactly as directed. It is very important to follow the schedule every cycle. If you do not use the ring as directed, you may not be protected from pregnancy. If the ring should slip out, is lost, or if you leave it in longer or shorter than you should, contact your health care professional for advice. What may interact with this medicine? Do not take this medicine with the following medications: -dasabuvir; ombitasvir; paritaprevir; ritonavir -ombitasvir; paritaprevir; ritonavir -vaginal lubricants or other vaginal products that are oil-based or silicone-based This medicine may also interact with the following medications: -acetaminophen -antibiotics or medicines for infections, especially rifampin, rifabutin, rifapentine, and griseofulvin, and possibly penicillins or tetracyclines -aprepitant or fosaprepitant -armodafinil -ascorbic acid (vitamin C) -barbiturate medicines, such as phenobarbital or primidone -bosentan -certain antiviral medicines for hepatitis, HIV or AIDS -certain medicines for cancer treatment -certain medicines for seizures like carbamazepine, clobazam, felbamate, lamotrigine, oxcarbazepine, phenytoin, rufinamide, topiramate -certain medicines for treating high cholesterol -cyclosporine -dantrolene -elagolix -flibanserin -grapefruit juice -lesinurad -medicines for diabetes -medicines to treat fungal infections, such as griseofulvin, miconazole, fluconazole, ketoconazole, itraconazole, posaconazole or voriconazole -mifepristone -mitotane -modafinil -morphine -mycophenolate -St. John's wort -tamoxifen -temazepam -theophylline or aminophylline -thyroid hormones -tizanidine -tranexamic acid -ulipristal -warfarin This list may not describe all possible interactions. Give your health care provider a list of all the medicines, herbs, non-prescription drugs, or dietary supplements you use. Also tell them if you smoke, drink  alcohol, or use illegal drugs. Some items may interact with your medicine. What should I watch for while using this medicine? Visit your doctor or health care professional for regular checks on your progress. You will need a regular breast and pelvic exam and Pap smear while on this medicine. Check with your doctor or health care professional to see if you need an additional method of contraception during the first cycle that you use this ring. Female condoms (made with natural rubber latex, polyisoprene, and polyurethane) and spermicides may be used. Do not use a diaphragm, cervical cap, or a female condom, as the ring can interfere with these birth control methods and their proper placement. If you have any reason to think you are pregnant, stop using this medicine right away and contact your doctor or health care professional. If you  are using this medicine for hormone related problems, it may take several cycles of use to see improvement in your condition. Smoking increases the risk of getting a blood clot or having a stroke while you are using hormonal birth control, especially if you are more than 31 years old. You are strongly advised not to smoke. Some women are prone to getting dark patches on the skin of the face (cholasma). Your risk of getting chloasma with this medicine is higher if you had chloasma during a pregnancy. Keep out of the sun. If you cannot avoid being in the sun, wear protective clothing and use sunscreen. Do not use sun lamps or tanning beds/booths. This medicine can make your body retain fluid, making your fingers, hands, or ankles swell. Your blood pressure can go up. Contact your doctor or health care professional if you feel you are retaining fluid. If you are going to have elective surgery, you may need to stop using this medicine before the surgery. Consult your health care professional for advice. This medicine does not protect you against HIV infection (AIDS) or any other  sexually transmitted diseases. What side effects may I notice from receiving this medicine? Side effects that you should report to your doctor or health care professional as soon as possible: -allergic reactions such as skin rash or itching, hives, swelling of the lips, mouth, tongue, or throat -depression -high blood pressure -migraines or severe, sudden headaches -signs and symptoms of a blood clot such as breathing problems; changes in vision; chest pain; severe, sudden headache; pain, swelling, warmth in the leg; trouble speaking; sudden numbness or weakness of the face, arm or leg -signs and symptoms of infection like fever or chills with dizziness and a sunburn-like rash, or pain or trouble passing urine -stomach pain -symptoms of vaginal infection like itching, irritation or unusual discharge -yellowing of the eyes or skin Side effects that usually do not require medical attention (report these to your doctor or health care professional if they continue or are bothersome): -acne -breast pain, tenderness -irregular vaginal bleeding or spotting, particularly during the first month of use -mild headache -nausea -painful periods -vomiting This list may not describe all possible side effects. Call your doctor for medical advice about side effects. You may report side effects to FDA at 1-800-FDA-1088. Where should I keep my medicine? Keep out of the reach of children. Store unopened rings in the original foil pouch at room temperature between 20 and 25 degrees C (68 and 77 degrees F) for up to 4 months. Protect from light. Do not store above 30 degrees C (86 degrees F). Throw away any unused medicine after the expiration date. A ring may only be used for 1 cycle (1 month). After the 3-week cycle, a used ring is removed and should be placed in the re-closable foil pouch and discarded in the trash out of reach of children and pets. Do NOT flush down the toilet. NOTE: This sheet is a summary.  It may not cover all possible information. If you have questions about this medicine, talk to your doctor, pharmacist, or health care provider.  2019 Elsevier/Gold Standard (2017-03-24 14:41:10) Health Maintenance, Female Adopting a healthy lifestyle and getting preventive care can go a long way to promote health and wellness. Talk with your health care provider about what schedule of regular examinations is right for you. This is a good chance for you to check in with your provider about disease prevention and staying healthy. In between checkups,  there are plenty of things you can do on your own. Experts have done a lot of research about which lifestyle changes and preventive measures are most likely to keep you healthy. Ask your health care provider for more information. Weight and diet Eat a healthy diet  Be sure to include plenty of vegetables, fruits, low-fat dairy products, and lean protein.  Do not eat a lot of foods high in solid fats, added sugars, or salt.  Get regular exercise. This is one of the most important things you can do for your health. ? Most adults should exercise for at least 150 minutes each week. The exercise should increase your heart rate and make you sweat (moderate-intensity exercise). ? Most adults should also do strengthening exercises at least twice a week. This is in addition to the moderate-intensity exercise. Maintain a healthy weight  Body mass index (BMI) is a measurement that can be used to identify possible weight problems. It estimates body fat based on height and weight. Your health care provider can help determine your BMI and help you achieve or maintain a healthy weight.  For females 69 years of age and older: ? A BMI below 18.5 is considered underweight. ? A BMI of 18.5 to 24.9 is normal. ? A BMI of 25 to 29.9 is considered overweight. ? A BMI of 30 and above is considered obese. Watch levels of cholesterol and blood lipids  You should start  having your blood tested for lipids and cholesterol at 31 years of age, then have this test every 5 years.  You may need to have your cholesterol levels checked more often if: ? Your lipid or cholesterol levels are high. ? You are older than 31 years of age. ? You are at high risk for heart disease. Cancer screening Lung Cancer  Lung cancer screening is recommended for adults 14-36 years old who are at high risk for lung cancer because of a history of smoking.  A yearly low-dose CT scan of the lungs is recommended for people who: ? Currently smoke. ? Have quit within the past 15 years. ? Have at least a 30-pack-year history of smoking. A pack year is smoking an average of one pack of cigarettes a day for 1 year.  Yearly screening should continue until it has been 15 years since you quit.  Yearly screening should stop if you develop a health problem that would prevent you from having lung cancer treatment. Breast Cancer  Practice breast self-awareness. This means understanding how your breasts normally appear and feel.  It also means doing regular breast self-exams. Let your health care provider know about any changes, no matter how small.  If you are in your 20s or 30s, you should have a clinical breast exam (CBE) by a health care provider every 1-3 years as part of a regular health exam.  If you are 25 or older, have a CBE every year. Also consider having a breast X-ray (mammogram) every year.  If you have a family history of breast cancer, talk to your health care provider about genetic screening.  If you are at high risk for breast cancer, talk to your health care provider about having an MRI and a mammogram every year.  Breast cancer gene (BRCA) assessment is recommended for women who have family members with BRCA-related cancers. BRCA-related cancers include: ? Breast. ? Ovarian. ? Tubal. ? Peritoneal cancers.  Results of the assessment will determine the need for genetic  counseling and BRCA1 and  BRCA2 testing. Cervical Cancer Your health care provider may recommend that you be screened regularly for cancer of the pelvic organs (ovaries, uterus, and vagina). This screening involves a pelvic examination, including checking for microscopic changes to the surface of your cervix (Pap test). You may be encouraged to have this screening done every 3 years, beginning at age 58.  For women ages 47-65, health care providers may recommend pelvic exams and Pap testing every 3 years, or they may recommend the Pap and pelvic exam, combined with testing for human papilloma virus (HPV), every 5 years. Some types of HPV increase your risk of cervical cancer. Testing for HPV may also be done on women of any age with unclear Pap test results.  Other health care providers may not recommend any screening for nonpregnant women who are considered low risk for pelvic cancer and who do not have symptoms. Ask your health care provider if a screening pelvic exam is right for you.  If you have had past treatment for cervical cancer or a condition that could lead to cancer, you need Pap tests and screening for cancer for at least 20 years after your treatment. If Pap tests have been discontinued, your risk factors (such as having a new sexual partner) need to be reassessed to determine if screening should resume. Some women have medical problems that increase the chance of getting cervical cancer. In these cases, your health care provider may recommend more frequent screening and Pap tests. Colorectal Cancer  This type of cancer can be detected and often prevented.  Routine colorectal cancer screening usually begins at 31 years of age and continues through 31 years of age.  Your health care provider may recommend screening at an earlier age if you have risk factors for colon cancer.  Your health care provider may also recommend using home test kits to check for hidden blood in the stool.  A  small camera at the end of a tube can be used to examine your colon directly (sigmoidoscopy or colonoscopy). This is done to check for the earliest forms of colorectal cancer.  Routine screening usually begins at age 53.  Direct examination of the colon should be repeated every 5-10 years through 31 years of age. However, you may need to be screened more often if early forms of precancerous polyps or small growths are found. Skin Cancer  Check your skin from head to toe regularly.  Tell your health care provider about any new moles or changes in moles, especially if there is a change in a mole's shape or color.  Also tell your health care provider if you have a mole that is larger than the size of a pencil eraser.  Always use sunscreen. Apply sunscreen liberally and repeatedly throughout the day.  Protect yourself by wearing long sleeves, pants, a wide-brimmed hat, and sunglasses whenever you are outside. Heart disease, diabetes, and high blood pressure  High blood pressure causes heart disease and increases the risk of stroke. High blood pressure is more likely to develop in: ? People who have blood pressure in the high end of the normal range (130-139/85-89 mm Hg). ? People who are overweight or obese. ? People who are African American.  If you are 17-62 years of age, have your blood pressure checked every 3-5 years. If you are 74 years of age or older, have your blood pressure checked every year. You should have your blood pressure measured twice-once when you are at a hospital or clinic,  and once when you are not at a hospital or clinic. Record the average of the two measurements. To check your blood pressure when you are not at a hospital or clinic, you can use: ? An automated blood pressure machine at a pharmacy. ? A home blood pressure monitor.  If you are between 40 years and 14 years old, ask your health care provider if you should take aspirin to prevent strokes.  Have regular  diabetes screenings. This involves taking a blood sample to check your fasting blood sugar level. ? If you are at a normal weight and have a low risk for diabetes, have this test once every three years after 31 years of age. ? If you are overweight and have a high risk for diabetes, consider being tested at a younger age or more often. Preventing infection Hepatitis B  If you have a higher risk for hepatitis B, you should be screened for this virus. You are considered at high risk for hepatitis B if: ? You were born in a country where hepatitis B is common. Ask your health care provider which countries are considered high risk. ? Your parents were born in a high-risk country, and you have not been immunized against hepatitis B (hepatitis B vaccine). ? You have HIV or AIDS. ? You use needles to inject street drugs. ? You live with someone who has hepatitis B. ? You have had sex with someone who has hepatitis B. ? You get hemodialysis treatment. ? You take certain medicines for conditions, including cancer, organ transplantation, and autoimmune conditions. Hepatitis C  Blood testing is recommended for: ? Everyone born from [birth year] through 1963. ? Anyone with known risk factors for hepatitis C. Sexually transmitted infections (STIs)  You should be screened for sexually transmitted infections (STIs) including gonorrhea and chlamydia if: ? You are sexually active and are younger than 31 years of age. ? You are older than 31 years of age and your health care provider tells you that you are at risk for this type of infection. ? Your sexual activity has changed since you were last screened and you are at an increased risk for chlamydia or gonorrhea. Ask your health care provider if you are at risk.  If you do not have HIV, but are at risk, it may be recommended that you take a prescription medicine daily to prevent HIV infection. This is called pre-exposure prophylaxis (PrEP). You are considered at  risk if: ? You are sexually active and do not regularly use condoms or know the HIV status of your partner(s). ? You take drugs by injection. ? You are sexually active with a partner who has HIV. Talk with your health care provider about whether you are at high risk of being infected with HIV. If you choose to begin PrEP, you should first be tested for HIV. You should then be tested every 3 months for as long as you are taking PrEP. Pregnancy  If you are premenopausal and you may become pregnant, ask your health care provider about preconception counseling.  If you may become pregnant, take 400 to 800 micrograms (mcg) of folic acid every day.  If you want to prevent pregnancy, talk to your health care provider about birth control (contraception). Osteoporosis and menopause  Osteoporosis is a disease in which the bones lose minerals and strength with aging. This can result in serious bone fractures. Your risk for osteoporosis can be identified using a bone density scan.  If you are  81 years of age or older, or if you are at risk for osteoporosis and fractures, ask your health care provider if you should be screened.  Ask your health care provider whether you should take a calcium or vitamin D supplement to lower your risk for osteoporosis.  Menopause may have certain physical symptoms and risks.  Hormone replacement therapy may reduce some of these symptoms and risks. Talk to your health care provider about whether hormone replacement therapy is right for you. Follow these instructions at home:  Schedule regular health, dental, and eye exams.  Stay current with your immunizations.  Do not use any tobacco products including cigarettes, chewing tobacco, or electronic cigarettes.  If you are pregnant, do not drink alcohol.  If you are breastfeeding, limit how much and how often you drink alcohol.  Limit alcohol intake to no more than 1 drink per day for nonpregnant women. One drink  equals 12 ounces of beer, 5 ounces of wine, or 1 ounces of hard liquor.  Do not use street drugs.  Do not share needles.  Ask your health care provider for help if you need support or information about quitting drugs.  Tell your health care provider if you often feel depressed.  Tell your health care provider if you have ever been abused or do not feel safe at home. This information is not intended to replace advice given to you by your health care provider. Make sure you discuss any questions you have with your health care provider. Document Released: 02/07/2011 Document Revised: 12/31/2015 Document Reviewed: 04/28/2015 Elsevier Interactive Patient Education  2019 [employer].

## 2019-01-15 NOTE — Progress Notes (Signed)
Subjective:     Meagan Ashley is a 33 y.o. female here for complaints of vaginal itching and possible STD exposure.  Had an elective abortion in February and had unprotected sex recently.  Is not on contraception either.  Wants to be checked for infection. Primary symptom is itching.  States her abortion experience "was awful and traumatizing".  States she watched "bloody trays" being carried and was "right next door to where they were screaming".  States  Wants to start [place] for contraception. Has used it before and liked it but her Rx ran out.  .   Gynecologic History Patient's last menstrual period was 01/06/2019. Contraception: none Last Pap: 2017. Results were: normal   Obstetric History OB History  Gravida Para Term Preterm AB Living  4 1 1   2 1   SAB TAB Ectopic Multiple Live Births    2     1    # Outcome Date GA Lbr Len/2nd Weight Sex Delivery Anes PTL Lv  4 Gravida           3 TAB           2 TAB           1 Term      CS-LTranv   LIV     The following portions of the patient's history were reviewed and updated as appropriate: allergies, current medications, past family history, past medical history, past social history, past surgical history and problem list.  Review of Systems Pertinent items noted in HPI and remainder of comprehensive ROS otherwise negative.    Objective:    BP 119/72   Pulse 78   Ht 5\' 6"  (1.676 m)   Wt 75.3 kg   LMP 01/06/2019   Breastfeeding Unknown   BMI 26.81 kg/m   General Appearance:    Alert, cooperative, no distress, appears stated age  Head:    Normocephalic, without obvious abnormality, atraumatic                 Back:     Symmetric, no curvature, ROM normal  Lungs:     respirations unlabored  Chest Wall:    No tenderness or deformity   Heart:    Regular rate and rhythm  Breast Exam:    No tenderness, masses, or nipple abnormality  Abdomen:     Soft, non-tender, bowel sounds active all four quadrants,    no masses, no  organomegaly  Genitalia:    Normal female without lesion, discharge or tenderness  Minimal discharge.   Rectal:    Deferred      Extremities:   Extremities normal, atraumatic, no cyanosis or edema     Skin:   Skin color, texture, turgor normal, no rashes or lesions     Neurologic:   sensation and reflexes throughout      Assessment:    Healthy female exam.   Possible STD exposure Vaginal itching Desires contraception   Plan:    Education reviewed: Nuvaring, safe sex. Contraception: NuvaRing vaginal inserts. Follow up in: 1 year.  Discussed if she gets near expiration of her Nuvanring, call us and we will Rx enough months to cover.  Encouraged to keep one yea exams, but not to let contraception lapse even if her exam is delayed.  Urine pregnancy test done today, negative

## 2019-01-16 LAB — CERVICOVAGINAL ANCILLARY ONLY
Bacterial vaginitis: NEGATIVE
Candida vaginitis: POSITIVE — AB
Chlamydia: NEGATIVE
Neisseria Gonorrhea: NEGATIVE
Trichomonas: NEGATIVE

## 2019-01-16 LAB — HEPATITIS C ANTIBODY: Hep C Virus Ab: <0.1 s/co ratio (ref 0.0–0.9)

## 2019-01-16 LAB — HIV ANTIBODY (ROUTINE TESTING W REFLEX): HIV Screen 4th Generation wRfx: NONREACTIVE

## 2019-01-16 LAB — HEPATITIS B SURFACE ANTIGEN: Hepatitis B Surface Ag: NEGATIVE

## 2019-01-16 LAB — RPR: RPR Ser Ql: NONREACTIVE

## 2019-01-17 ENCOUNTER — Telehealth: Payer: Self-pay

## 2019-01-17 ENCOUNTER — Other Ambulatory Visit: Payer: Self-pay | Admitting: Advanced Practice Midwife

## 2019-01-17 LAB — CYTOLOGY - PAP
Diagnosis: NEGATIVE
HPV: NOT DETECTED

## 2019-01-17 MED ORDER — FLUCONAZOLE 150 MG PO TABS: 150.0000 mg | ORAL_TABLET | Freq: Once | ORAL | 3 refills | Status: AC

## 2019-01-17 MED ORDER — FLUCONAZOLE 150 MG PO TABS: 150.0000 mg | ORAL_TABLET | Freq: Once | ORAL | 3 refills | Status: DC

## 2019-01-17 NOTE — Telephone Encounter (Signed)
Patient called and id'd by name and dob. Patient made aware that STD labs are all within normal limits. Patient made aware that she does have a yeast infection - patient states she has taken one dose of diflucan but still having some itching. Made her aware that she can call and get a refill on the diflucan since its been two days. Kathrene Alu RN

## 2019-04-02 ENCOUNTER — Encounter: Payer: Self-pay | Admitting: Advanced Practice Midwife

## 2019-04-02 ENCOUNTER — Other Ambulatory Visit: Payer: Self-pay

## 2019-04-02 ENCOUNTER — Ambulatory Visit (INDEPENDENT_AMBULATORY_CARE_PROVIDER_SITE_OTHER): Payer: Managed Care, Other (non HMO) | Admitting: Advanced Practice Midwife

## 2019-04-02 VITALS — BP 125/75 | HR 86 | Ht 66.0 in | Wt 165.0 lb

## 2019-04-02 DIAGNOSIS — Z202 Contact with and (suspected) exposure to infections with a predominantly sexual mode of transmission: Secondary | ICD-10-CM | POA: Insufficient documentation

## 2019-04-02 DIAGNOSIS — Z113 Encounter for screening for infections with a predominantly sexual mode of transmission: Secondary | ICD-10-CM

## 2019-04-02 DIAGNOSIS — N898 Other specified noninflammatory disorders of vagina: Secondary | ICD-10-CM

## 2019-04-02 NOTE — Progress Notes (Signed)
  Subjective:     Patient ID: Meagan Ashley, female   DOB: [birth year], 33 y.o.   MRN: [id number]  Vaginal Discharge The patient's primary symptoms include vaginal discharge. The patient's pertinent negatives include no genital itching, genital lesions, genital odor, pelvic pain or vaginal bleeding. Primary symptoms comment: condom came off. This is a recurrent problem. The problem occurs intermittently. The problem has been unchanged. The patient is experiencing no pain. Pertinent negatives include no abdominal pain, back pain, chills, diarrhea, fever, nausea or vomiting. The vaginal discharge was milky. She is sexually active.     Review of Systems  Constitutional: Negative for chills and fever.  Gastrointestinal: Negative for abdominal pain, diarrhea, nausea and vomiting.  Genitourinary: Positive for vaginal discharge. Negative for pelvic pain.  Musculoskeletal: Negative for back pain.       Objective:   Physical Exam Constitutional:      General: She is not in acute distress.    Appearance: Normal appearance.  Cardiovascular:     Rate and Rhythm: Normal rate.  Pulmonary:     Effort: Pulmonary effort is normal.  Abdominal:     General: Abdomen is flat. There is no distension.     Palpations: Abdomen is soft.     Tenderness: There is no abdominal tenderness. There is no guarding.  Genitourinary:    Comments: Thin white discharge EGBUS normal No lesions Cx closed, no CMT Adnexa nontender Musculoskeletal: Normal range of motion.  Skin:    General: Skin is warm and dry.  Neurological:     General: No focal deficit present.     Mental Status: She is alert.  Psychiatric:        Mood and Affect: Mood normal.        Assessment:     Possible exposure to STD due to condom coming off    Plan:     Cultures sent, will notify of results via [place] sex reviewed Happy with use of Nuvaring

## 2019-04-02 NOTE — Progress Notes (Signed)
Patient states she has a condom break and would like to be checked for STD. Kathrene Alu RN

## 2019-04-02 NOTE — Patient Instructions (Signed)

## 2019-04-08 LAB — CERVICOVAGINAL ANCILLARY ONLY
Bacterial vaginitis: NEGATIVE
Candida vaginitis: NEGATIVE
Chlamydia: NEGATIVE
Neisseria Gonorrhea: NEGATIVE
Trichomonas: NEGATIVE

## 2019-10-14 ENCOUNTER — Telehealth: Payer: Self-pay

## 2019-10-14 DIAGNOSIS — B3731 Acute candidiasis of vulva and vagina: Secondary | ICD-10-CM

## 2019-10-14 DIAGNOSIS — B373 Candidiasis of vulva and vagina: Secondary | ICD-10-CM

## 2019-10-14 MED ORDER — FLUCONAZOLE 150 MG PO TABS: 150.0000 mg | ORAL_TABLET | Freq: Once | ORAL | 0 refills | Status: AC

## 2019-10-24 ENCOUNTER — Ambulatory Visit: Payer: Managed Care, Other (non HMO) | Admitting: Family Medicine

## 2019-10-24 DIAGNOSIS — N9489 Other specified conditions associated with female genital organs and menstrual cycle: Secondary | ICD-10-CM

## 2019-10-28 ENCOUNTER — Telehealth: Payer: Self-pay

## 2019-10-28 NOTE — Telephone Encounter (Signed)
Returned patient phone call. Left message for her to return call to office. Armandina Stammer RN

## 2019-10-30 ENCOUNTER — Ambulatory Visit (INDEPENDENT_AMBULATORY_CARE_PROVIDER_SITE_OTHER): Payer: Self-pay

## 2019-10-30 ENCOUNTER — Other Ambulatory Visit: Payer: Self-pay

## 2019-10-30 VITALS — BP 110/64 | HR 67 | Ht 66.0 in | Wt 164.0 lb

## 2019-10-30 DIAGNOSIS — N898 Other specified noninflammatory disorders of vagina: Secondary | ICD-10-CM

## 2019-10-30 DIAGNOSIS — Z113 Encounter for screening for infections with a predominantly sexual mode of transmission: Secondary | ICD-10-CM

## 2019-10-30 DIAGNOSIS — B373 Candidiasis of vulva and vagina: Secondary | ICD-10-CM

## 2019-10-30 NOTE — Progress Notes (Addendum)
Pt states she has been having some vaginal discharge x 2 months. Pt denies itching, odor, and any new sex partners. Pt requests STI screening.Self swab was sent to the lab.  Marwin Primmer l Dima Ferrufino, CMA  Attestation of Attending Supervision of CMA/RN: Evaluation and management procedures were performed by the nurse under my supervision and collaboration.  I have reviewed the nursing note and chart, and I agree with the management and plan.  Carolyn L. Harraway-Smith, M.D., [facility]

## 2019-10-31 ENCOUNTER — Other Ambulatory Visit: Payer: Self-pay | Admitting: Obstetrics & Gynecology

## 2019-10-31 DIAGNOSIS — B373 Candidiasis of vulva and vagina: Secondary | ICD-10-CM

## 2019-10-31 DIAGNOSIS — B3731 Acute candidiasis of vulva and vagina: Secondary | ICD-10-CM

## 2019-10-31 LAB — CERVICOVAGINAL ANCILLARY ONLY
Bacterial Vaginitis (gardnerella): NEGATIVE
Candida Glabrata: NEGATIVE
Candida Vaginitis: POSITIVE — AB
Chlamydia: NEGATIVE
Comment: NEGATIVE
Comment: NEGATIVE
Comment: NEGATIVE
Comment: NEGATIVE
Comment: NEGATIVE
Comment: NORMAL
Neisseria Gonorrhea: NEGATIVE
Trichomonas: NEGATIVE

## 2019-10-31 MED ORDER — FLUCONAZOLE 150 MG PO TABS: 150.0000 mg | ORAL_TABLET | Freq: Once | ORAL | 2 refills | Status: DC

## 2019-11-01 ENCOUNTER — Other Ambulatory Visit: Payer: Self-pay

## 2019-11-01 DIAGNOSIS — B379 Candidiasis, unspecified: Secondary | ICD-10-CM

## 2019-11-01 MED ORDER — FLUCONAZOLE 150 MG PO TABS: 150.0000 mg | ORAL_TABLET | Freq: Once | ORAL | 2 refills | Status: AC

## 2019-11-01 NOTE — Progress Notes (Signed)
Pt called the office stating Diflucan Rx was not at her pharmacy. Medication was prescribed on 11/01/19. Lyndsee Casa l Hanad Leino, CMA

## 2020-01-08 ENCOUNTER — Ambulatory Visit (INDEPENDENT_AMBULATORY_CARE_PROVIDER_SITE_OTHER): Payer: 59 | Admitting: Family Medicine

## 2020-01-08 ENCOUNTER — Other Ambulatory Visit: Payer: Self-pay

## 2020-01-08 ENCOUNTER — Ambulatory Visit: Payer: Self-pay

## 2020-01-08 ENCOUNTER — Encounter: Payer: Self-pay | Admitting: Family Medicine

## 2020-01-08 DIAGNOSIS — M25531 Pain in right wrist: Secondary | ICD-10-CM | POA: Diagnosis not present

## 2020-01-08 MED ORDER — NABUMETONE 750 MG PO TABS: 750.0000 mg | ORAL_TABLET | Freq: Two times a day (BID) | ORAL | 6 refills | Status: AC | PRN

## 2020-01-08 NOTE — Progress Notes (Signed)
Office Visit Note   Patient: Meagan Ashley           Date of Birth: 31-Jul-1986           MRN: [id number] Visit Date: 01/08/2020 Requested by: No referring provider defined for this encounter. PCP: Patient, No Pcp Per  Subjective: Chief Complaint  Patient presents with  . Right Wrist - Pain    Pain in wrist since she started kickboxing in December 2020. H/o ganglion cyst removal 3 years ago. No problems from that time until December. Wakes up with pain daily - has to "pop" the wrist with a small twist to alleviate pain. Right-hand dominant.    HPI: She is here with right wrist pain.  She is right-hand dominant.  Several years ago she had a dorsal ganglion cyst removed.  She was pain-free after that until December.  Around that time she started doing kickboxing classes.  She does not remember a specific injury, but she has had progressively worsening pain on the dorsum of her wrist and now she feels like another cyst is forming.  She denies numbness or tingling in her hand.  She is not taking anything for the pain.  It is worse at night after sleeping.               ROS:   All other systems were reviewed and are negative.  Objective: Vital Signs: There were no vitals taken for this visit.  Physical Exam:  General:  Alert and oriented, in no acute distress. Pulm:  Breathing unlabored. Psy:  Normal mood, congruent affect. Skin: There is no bruising or erythema Right wrist: There is a old surgical scar on the dorsum of her wrist.  She is tender to palpation in that area and it feels like she has another ganglion.  She has full range of motion of the wrist compared to the left, but pain with dorsiflexion and volar flexion.  No tenderness on the volar aspect of the wrist.  Imaging: US Guided Needle Placement  Result Date: 01/08/2020 Limited diagnostic ultrasound reveals a dorsal ganglion cyst with extension toward the lunate bone.  No tenosynovitis.  XR Wrist Complete Right  Result  Date: 01/08/2020 X-rays right wrist reveal normal anatomic alignment with no degenerative change, no sign of fracture or AVN.  Joint spacing looks normal.   Assessment & Plan: 1.  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 -She will sleep in a wrist brace for comfort.     Procedures: No procedures performed  No notes on file     PMFS History: Patient Active Problem List   Diagnosis Date Noted  . Possible exposure to STD 04/02/2019  . Atypical squamous cell changes of undetermined significance (ASCUS) on cervical cytology with positive high risk human papilloma virus (HPV) 03/18/2014   Past Medical History:  Diagnosis Date  . Chlamydia 2010  . Complication of anesthesia    itching  . Hypertension     Family History  Problem Relation Age of Onset  . Breast cancer Mother   . Hypertension Mother     Past Surgical History:  Procedure Laterality Date  . BREAST SURGERY  27/27/2012  . CESAREAN SECTION    . GANGLION CYST EXCISION    . THERAPEUTIC ABORTION     Social History   Occupational History  . Not on file  Tobacco Use  . Smoking status: Never Smoker  . Smokeless tobacco: Never Used  Substance and Sexual Activity  . Alcohol use: No  . Drug use: No  . Sexual activity: Yes    Birth control/protection: None

## 2020-01-20 ENCOUNTER — Other Ambulatory Visit (HOSPITAL_COMMUNITY)
Admission: RE | Admit: 2020-01-20 | Discharge: 2020-01-20 | Disposition: A | Discharge status: Home / Self Care | Payer: 59 | Source: Ambulatory Visit | Attending: Obstetrics & Gynecology | Admitting: Obstetrics & Gynecology

## 2020-01-20 ENCOUNTER — Other Ambulatory Visit: Payer: Self-pay

## 2020-01-20 ENCOUNTER — Ambulatory Visit: Payer: 59

## 2020-01-20 VITALS — BP 116/68 | HR 65 | Ht 65.0 in | Wt 162.0 lb

## 2020-01-20 DIAGNOSIS — N898 Other specified noninflammatory disorders of vagina: Secondary | ICD-10-CM | POA: Insufficient documentation

## 2020-01-20 NOTE — Progress Notes (Addendum)
Patient having discharge after her periods. Talked about using a fragrance free tampon. Patient states she is irritated and itching at night. Patient states she did take 2 diflucan but the symptoms are not fully better. Patient performed self swab and we will wait for results before treating her for anything.  Armandina Stammer, RN   Attestation of Attending Supervision of CMA/RN: Evaluation and management procedures were performed by the nurse under my supervision and collaboration.  I have reviewed the nursing note and chart, and I agree with the management and plan.  Carolyn L. Harraway-Smith, M.D., [facility]

## 2020-01-22 ENCOUNTER — Ambulatory Visit
Admission: RE | Admit: 2020-01-22 | Discharge: 2020-01-22 | Disposition: A | Discharge status: Home / Self Care | Payer: 59 | Source: Ambulatory Visit | Attending: Family Medicine | Admitting: Family Medicine

## 2020-01-22 ENCOUNTER — Telehealth: Payer: Self-pay

## 2020-01-22 ENCOUNTER — Other Ambulatory Visit: Payer: Self-pay

## 2020-01-22 DIAGNOSIS — M25531 Pain in right wrist: Secondary | ICD-10-CM

## 2020-01-22 LAB — CERVICOVAGINAL ANCILLARY ONLY
Bacterial Vaginitis (gardnerella): NEGATIVE
Candida Glabrata: NEGATIVE
Candida Vaginitis: NEGATIVE
Chlamydia: NEGATIVE
Comment: NEGATIVE
Comment: NEGATIVE
Comment: NEGATIVE
Comment: NEGATIVE
Comment: NEGATIVE
Comment: NORMAL
Neisseria Gonorrhea: NEGATIVE
Trichomonas: NEGATIVE

## 2020-01-22 MED ORDER — IOPAMIDOL (ISOVUE-M 200) INJECTION 41%
3.0000 mL | Freq: Once | INTRAMUSCULAR | Status: AC
  Administered 2020-01-22: 3 mL via INTRA_ARTICULAR

## 2020-01-22 NOTE — Telephone Encounter (Signed)
Patient made aware that her vaginal culture came back negative for BV, yeast, trich, Gc and Chl. Patient states she is still having a little discharge. Encourage to try the organic tampons like we discussed and keep a calendar of her symptoms along with her period and if it is still a problem after two or three months then we will have her symptoms chart to evaluate. Patient states understanding. Armandina Stammer RN

## 2020-01-23 ENCOUNTER — Telehealth: Payer: Self-pay | Admitting: Family Medicine

## 2020-01-23 NOTE — Telephone Encounter (Signed)
MRI does not show any definite ligament tears.

## 2022-02-09 IMAGING — MR MR WRIST*R* W/ CM
6 series · 40 of 40 positions shown · IV contrast (agent unspecified)
Comparison: 01/08/2020 radiographs

CLINICAL DATA: Persistent right wrist pain since July 2019
after a boxing injury. Prior wrist surgery 3 years ago, reportedly a
cyst was removed.

EXAM:
MRI OF THE RIGHT WRIST WITH CONTRAST (MR Arthrogram)
TECHNIQUE: Multiplanar, multisequence MR imaging of the wrist was performed
immediately following contrast injection into the radiocarpal joint
under fluoroscopic guidance. No intravenous contrast was
administered.

[Series 3: T1 fat-sat · axial · 3.0mm · 0.20mm/px · z∈[-86,-20]mm · 7 of 20 slices shown]
[im 1/20]
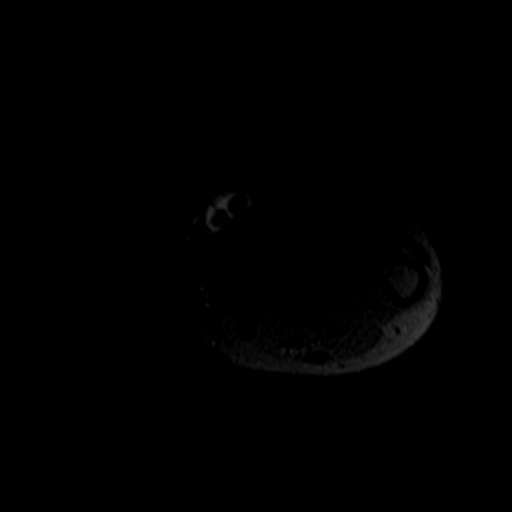
[im 4/20]
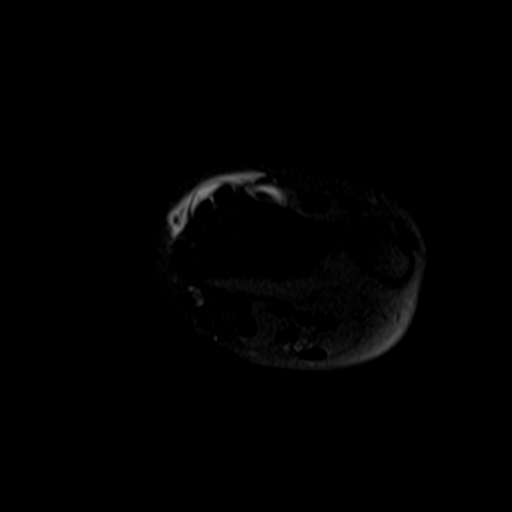
[im 7/20]
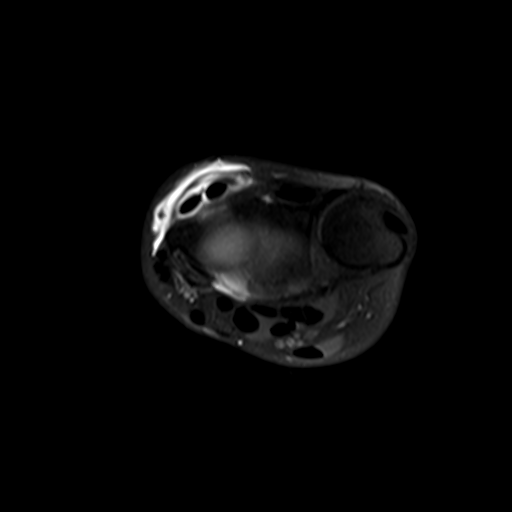
[im 10/20]
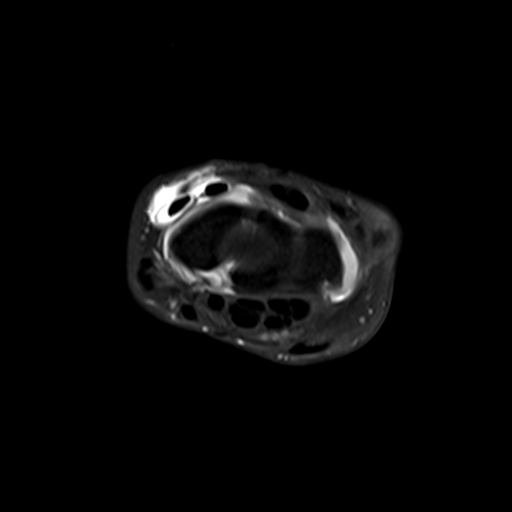
[im 13/20]
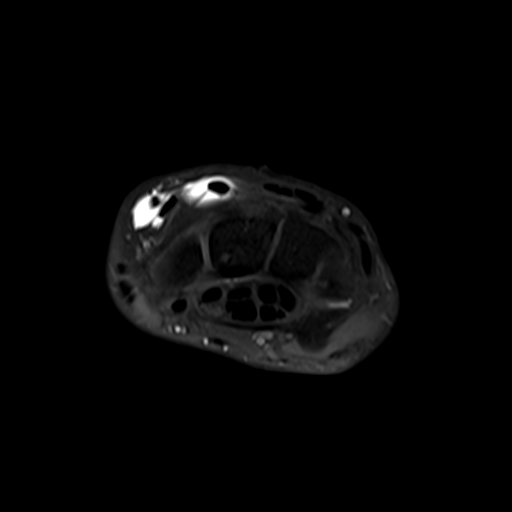
[im 16/20]
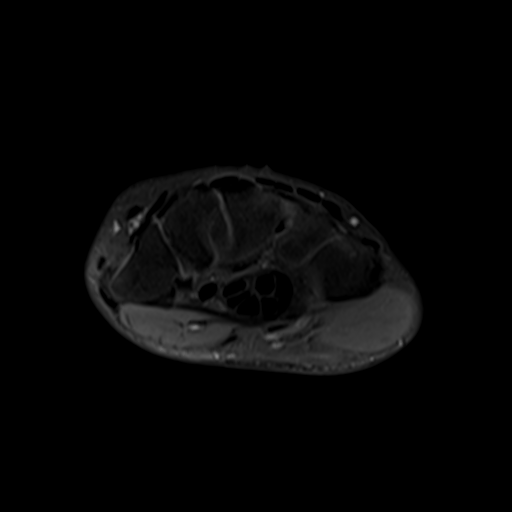
[im 20/20]
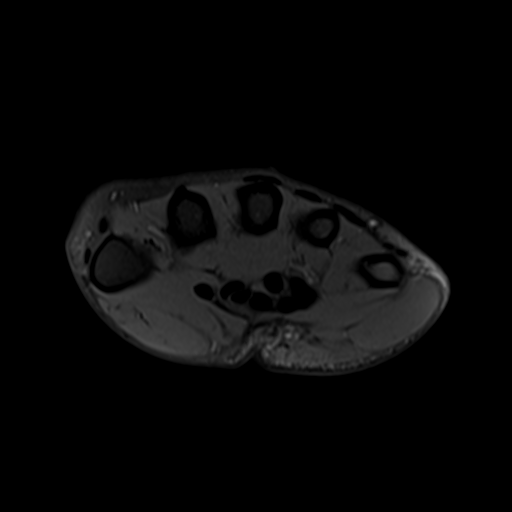

[Series 4: T2 fat-sat · axial · 3.0mm · 0.47mm/px · z∈[-87,-34]mm · 6 of 16 slices shown (1 of 2)]
[im 1/16]
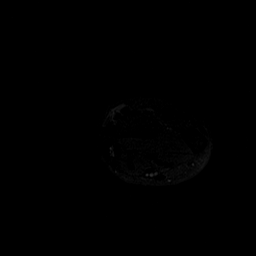
[im 4/16]
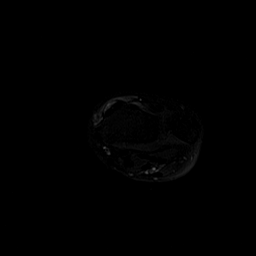
[im 7/16]
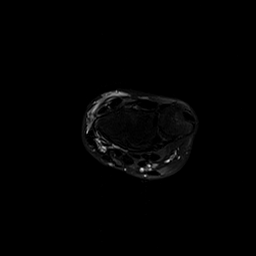
[im 10/16]
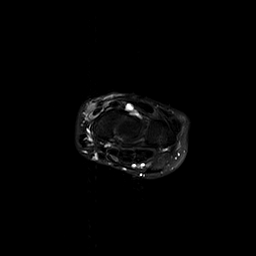
[im 13/16]
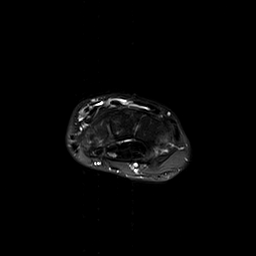
[im 16/16]
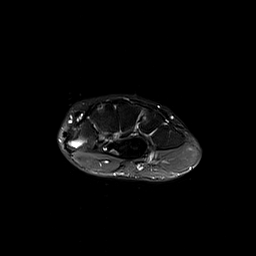

[Series 5: t1_tse_cor_fs · coronal · 3.0mm · 0.39mm/px · 6 of 16 slices shown]
[im 1/16]
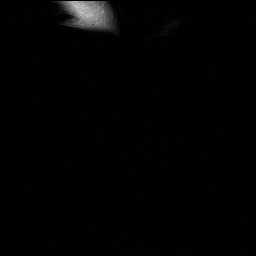
[im 4/16]
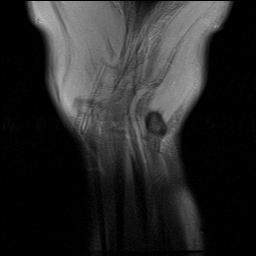
[im 7/16]
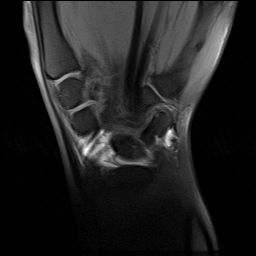
[im 10/16]
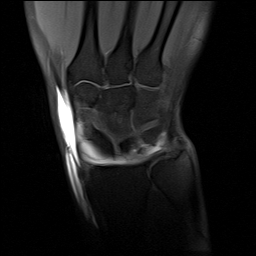
[im 13/16]
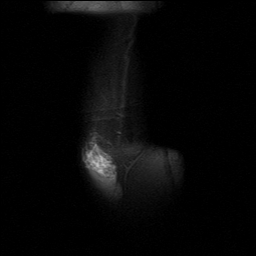
[im 16/16]
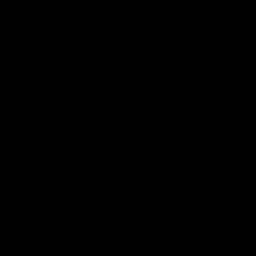

[Series 6: t1_tse_cor_no fs · coronal · 3.0mm · 0.39mm/px · 6 of 16 slices shown]
[im 1/16]
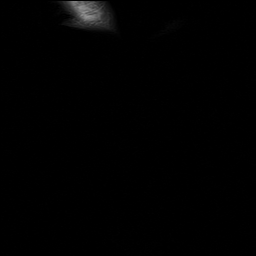
[im 4/16]
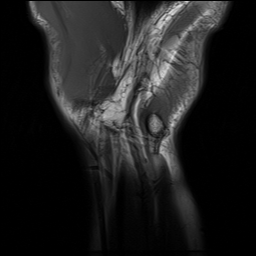
[im 7/16]
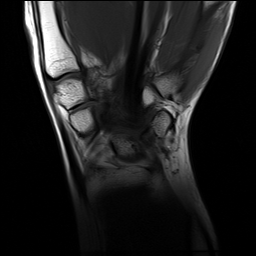
[im 10/16]
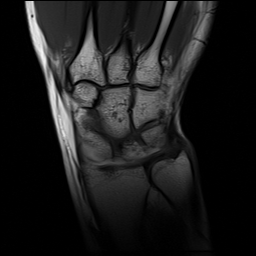
[im 13/16]
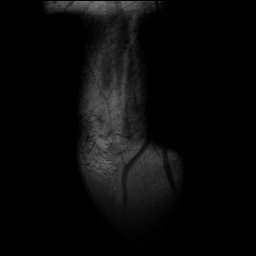
[im 16/16]
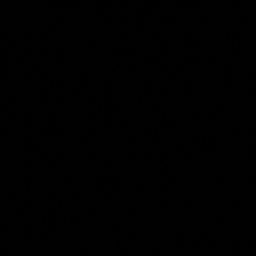

[Series 7: T2 fat-sat · coronal · 3.0mm · 0.39mm/px · 6 of 16 slices shown (2 of 2)]
[im 1/16]
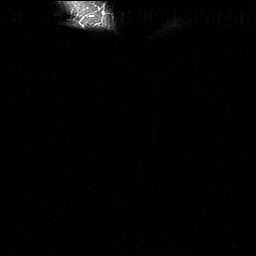
[im 4/16]
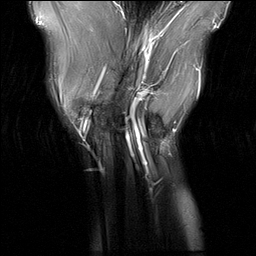
[im 7/16]
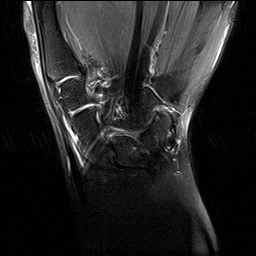
[im 10/16]
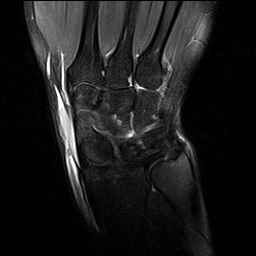
[im 13/16]
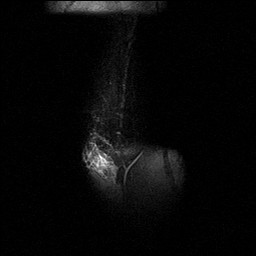
[im 16/16]
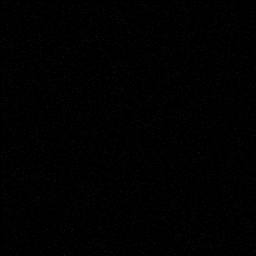

[Series 8: t1_tse_sag_fs · sagittal · 3.0mm · 0.39mm/px · 9 of 24 slices shown]
[im 1/24]
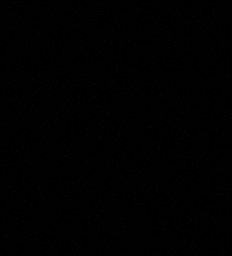
[im 3/24]
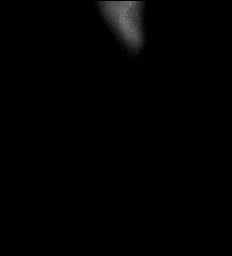
[im 6/24]
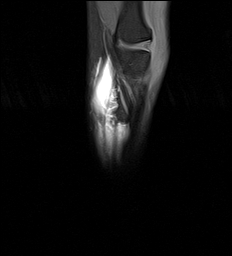
[im 9/24]
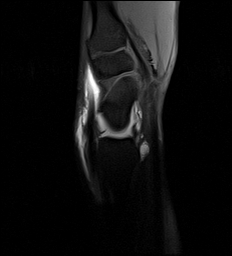
[im 12/24]
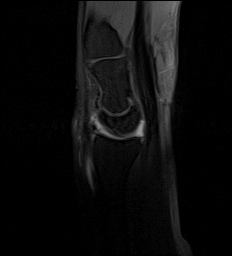
[im 15/24]
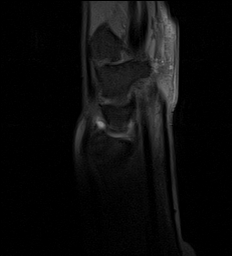
[im 18/24]
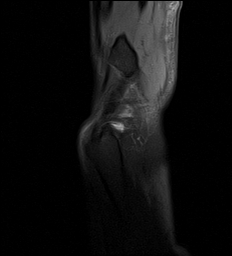
[im 21/24]
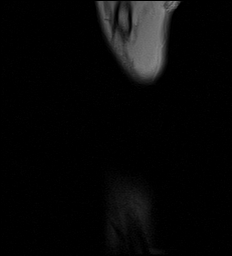
[im 24/24]
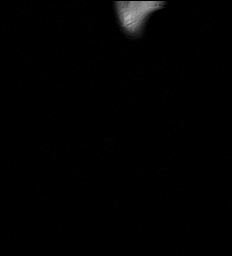

[40 of 40 positions shown; findings below may reference images not displayed]

FINDINGS: There is some T2 signal dropout in the injected contrast medium
probably due to slightly high concentration of gadolinium. This is
not felt to significantly affect diagnosis in this case.

Ligaments: Intrinsic interosseous ligaments appear intact. There is
some dorsal extravasation of contrast but without obvious dorsal
ligamentous deficiency, this is likely incidental and injection
related. Contrast tracks in the second and third extensor
compartment tendon sheaths.

Triangular fibrocartilage: Unremarkable

Tendons: Unremarkable

Carpal tunnel/median nerve: Unremarkable

Guyon's canal: Unremarkable

Joint/cartilage: Preserved articular cartilage.

Bones/carpal alignment: No fracture or acute bony findings. Small
geode along the volar capitate, image [DATE].

Other: On image [DATE], there is a 0.8 by 0.4 by 0.7 cm collection of
contrast likely in continuity with the joint, extending from the
volar radiocarpal joint and in between the radial artery, the flexor
carpi radialis, and the flexor pollicis longus tendons as shown on
image [DATE]. This may reflect reflect a small ganglion cyst in
continuity with the joint.
IMPRESSION: 1. Small collection of contrast likely in continuity with the joint,
extending from the volar radiocarpal joint and in between the radial
artery, the flexor carpi radialis, and the flexor pollicis longus
tendons as shown on image [DATE], may reflect a small ganglion cyst in
continuity with the joint.
2. There is some dorsal extravasation of contrast but without
obvious dorsal ligamentous deficiency, this is likely incidental and
injection related.
3. No TFCC tear or tear of the intrinsic interosseous ligaments.
